# Patient Record
Sex: Male | Born: 1965 | Race: White | Hispanic: No | Marital: Married | Smoking: Former smoker
Health system: Southern US, Community
[De-identification: ages and names within clinical notes are randomized; demographics above are authoritative.]

## PROBLEM LIST (undated history)

## (undated) DIAGNOSIS — F1010 Alcohol abuse, uncomplicated: Secondary | ICD-10-CM

## (undated) HISTORY — PX: OTHER SURGICAL HISTORY: SHX169

---

## 2012-10-07 ENCOUNTER — Emergency Department (HOSPITAL_COMMUNITY)
Admission: EM | Admit: 2012-10-07 | Discharge: 2012-10-08 | Disposition: A | Discharge status: Other Acute Inpatient Hospital | Payer: BC Managed Care – PPO | Attending: Emergency Medicine | Admitting: Emergency Medicine

## 2012-10-07 ENCOUNTER — Encounter (HOSPITAL_COMMUNITY): Payer: Self-pay

## 2012-10-07 DIAGNOSIS — F1010 Alcohol abuse, uncomplicated: Secondary | ICD-10-CM

## 2012-10-07 DIAGNOSIS — Z87891 Personal history of nicotine dependence: Secondary | ICD-10-CM | POA: Insufficient documentation

## 2012-10-07 HISTORY — DX: Alcohol abuse, uncomplicated: F10.10

## 2012-10-07 LAB — RAPID URINE DRUG SCREEN, HOSP PERFORMED
Amphetamines: NOT DETECTED
Benzodiazepines: NOT DETECTED
Cocaine: NOT DETECTED
Opiates: NOT DETECTED

## 2012-10-07 LAB — SALICYLATE LEVEL: Salicylate Lvl: <2 mg/dL — ABNORMAL LOW (ref 2.8–20.0)

## 2012-10-07 LAB — COMPREHENSIVE METABOLIC PANEL
ALT: 29 U/L (ref 0–53)
AST: 33 U/L (ref 0–37)
Albumin: 4.2 g/dL (ref 3.5–5.2)
Calcium: 9.1 mg/dL (ref 8.4–10.5)
Creatinine, Ser: 0.48 mg/dL — ABNORMAL LOW (ref 0.50–1.35)
GFR calc non Af Amer: >90 mL/min (ref >90)
Sodium: 132 mEq/L — ABNORMAL LOW (ref 135–145)
Total Protein: 8.3 g/dL (ref 6.0–8.3)

## 2012-10-07 LAB — CBC
MCH: 33.6 pg (ref 26.0–34.0)
MCHC: 36.1 g/dL — ABNORMAL HIGH (ref 30.0–36.0)
MCV: 93 fL (ref 78.0–100.0)
Platelets: 234 10*3/uL (ref 150–400)
RDW: 12.6 % (ref 11.5–15.5)

## 2012-10-07 MED ORDER — THIAMINE HCL 100 MG/ML IJ SOLN: 100.0000 mg | Freq: Every day | INTRAMUSCULAR | Status: DC

## 2012-10-07 MED ORDER — LORAZEPAM 1 MG PO TABS: 1.0000 mg | ORAL_TABLET | Freq: Four times a day (QID) | ORAL | Status: DC | PRN

## 2012-10-07 MED ORDER — ADULT MULTIVITAMIN W/MINERALS CH
1.0000 | ORAL_TABLET | Freq: Every day | ORAL | Status: DC
  Administered 2012-10-07 – 2012-10-08 (×2): 1 via ORAL
  Filled 2012-10-07 (×2): qty 1

## 2012-10-07 MED ORDER — VITAMIN B-1 100 MG PO TABS
100.0000 mg | ORAL_TABLET | Freq: Every day | ORAL | Status: DC
  Administered 2012-10-07 – 2012-10-08 (×2): 100 mg via ORAL
  Filled 2012-10-07 (×2): qty 1

## 2012-10-07 MED ORDER — FOLIC ACID 1 MG PO TABS
1.0000 mg | ORAL_TABLET | Freq: Every day | ORAL | Status: DC
  Administered 2012-10-07 – 2012-10-08 (×2): 1 mg via ORAL
  Filled 2012-10-07 (×2): qty 1

## 2012-10-07 MED ORDER — PNEUMOCOCCAL VAC POLYVALENT 25 MCG/0.5ML IJ INJ
0.5000 mL | INJECTION | INTRAMUSCULAR | Status: AC
  Administered 2012-10-08: 0.5 mL via INTRAMUSCULAR
  Filled 2012-10-07 (×2): qty 0.5

## 2012-10-07 MED ORDER — LORAZEPAM 1 MG PO TABS
0.0000 mg | ORAL_TABLET | Freq: Four times a day (QID) | ORAL | Status: DC
  Administered 2012-10-07 – 2012-10-08 (×2): 1 mg via ORAL
  Filled 2012-10-07 (×2): qty 1

## 2012-10-07 MED ORDER — LORAZEPAM 1 MG PO TABS: 0.0000 mg | ORAL_TABLET | Freq: Two times a day (BID) | ORAL | Status: DC

## 2012-10-07 MED ORDER — LORAZEPAM 2 MG/ML IJ SOLN: 1.0000 mg | Freq: Four times a day (QID) | INTRAMUSCULAR | Status: DC | PRN

## 2012-10-07 NOTE — ED Provider Notes (Signed)
History    This chart was scribed for a non-physician practitioner, Roxy Horseman, PA-C, working with Nelia Shi, MD by Frederik Pear, ED Scribe. This patient was seen in room WTR1/WLPT1 and the patient's care was started at 1741.   CSN: [id number]  Arrival date & time 10/07/12  1512   First MD Initiated Contact with Patient 10/07/12 1741      Chief Complaint  Patient presents with  . Medical Clearance  . Alcohol Intoxication    (Consider location/radiation/quality/duration/timing/severity/associated sxs/prior treatment) The history is provided by the patient and medical records. No language interpreter was used.   Jared [name] is a 47 y.o. male with a h/o of ETOH abuse who presents to the Emergency Department with a chief complaint of medical clearance for ETOH detox. He states that he called his sister earlier today and told her that he was ready to seek help so she called around and was able to obtain a spot for [facility] at [facility] in a few days after he was cleared in ED. He reports that he has been drinking for years, and has never attempted to quit on his own. He reports that he consumed 8-9 beers earlier today with the last one being between 1500- 1600, which was just prior to his arrival. He denies, hallucinations, SI, and HI. He reports that he has one previous admit to rehab that was several years ago, but states that he began drinking within 8 to 10 days of leaving the program.   Past Medical History  Diagnosis Date  . Alcohol abuse     Past Surgical History  Procedure Laterality Date  . Lymph node removal      Family History  Problem Relation Age of Onset  . Cancer Mother   . Alzheimer's disease Father     History  Substance Use Topics  . Smoking status: Former Games developer  . Smokeless tobacco: Never Used  . Alcohol Use: Yes     Comment: daily      Review of Systems A complete 10 system review of systems was obtained and all systems are negative except as  noted in the HPI and PMH.  Allergies  Review of patient's allergies indicates no known allergies.  Home Medications   Current Outpatient Rx  Name  Route  Sig  Dispense  Refill  . MELATONIN PO   Oral   Take by mouth.         . Multiple Vitamin (MULTIVITAMIN WITH MINERALS) TABS   Oral   Take 1 tablet by mouth daily.           BP 128/82  Pulse 69  Temp(Src) 98.4 F (36.9 C) (Oral)  Resp 16  SpO2 100%  Physical Exam  Nursing note and vitals reviewed. Constitutional: He is oriented to person, place, and time. He appears well-developed and well-nourished. No distress.  HENT:  Head: Normocephalic and atraumatic.  Eyes: EOM are normal. Pupils are equal, round, and reactive to light.  Neck: Normal range of motion. Neck supple. No tracheal deviation present.  Cardiovascular: Normal rate, regular rhythm, normal heart sounds and intact distal pulses.  Exam reveals no gallop and no friction rub.   No murmur heard. Pulmonary/Chest: Effort normal and breath sounds normal. No respiratory distress. He has no wheezes. He has no rales. He exhibits no tenderness.  Abdominal: Soft. He exhibits no distension and no mass. There is no tenderness. There is no rebound and no guarding.  No acute abdominal pain.  Musculoskeletal:  Normal range of motion. He exhibits no edema.  Neurological: He is alert and oriented to person, place, and time.  Skin: Skin is warm and dry.  Psychiatric: He has a normal mood and affect. His behavior is normal.    ED Course  Procedures (including critical care time)  DIAGNOSTIC STUDIES: Oxygen Saturation is 100% on room air, normal by my interpretation.    COORDINATION OF CARE:  17:40- Discussed planned course of treatment with the patient, including an ACT consult, who is agreeable at this time.  Labs Reviewed  CBC - Abnormal; Notable for the following:    MCHC 36.1 (*)    All other components within normal limits  COMPREHENSIVE METABOLIC PANEL -  Abnormal; Notable for the following:    Sodium 132 (*)    Chloride 95 (*)    BUN 4 (*)    Creatinine, Ser 0.48 (*)    Total Bilirubin 0.2 (*)    All other components within normal limits  ETHANOL - Abnormal; Notable for the following:    Alcohol, Ethyl (B) 279 (*)    All other components within normal limits  SALICYLATE LEVEL - Abnormal; Notable for the following:    Salicylate Lvl <2.0 (*)    All other components within normal limits  ACETAMINOPHEN LEVEL  URINE RAPID DRUG SCREEN (HOSP PERFORMED)   No results found.   1. Alcohol abuse       MDM   I personally performed the services described in this documentation, which was scribed in my presence. The recorded information has been reviewed and is accurate.         Roxy Horseman, PA-C 10/18/12 1114

## 2012-10-07 NOTE — BH Assessment (Signed)
Assessment Note   Jared [name] is an 47 y.o. male with a 20 yr h/o of ETOH abuse. He presents to the [facility] Emergency Department requesting alcohol detox. He states that he called his sister earlier today and told her that he was ready to seek help. She called around and spoke to staff at [facility]. She was reportedly told that no beds were available at this time but a bed may become available in a few days. Patient was advised to come to [facility] for medical clearance. He reports that he has been drinking daily years. He reports that he consumed 8-9 beers earlier today. His average amount of use is 6-10 beers per day. He denies withdrawal symptoms. No history of black outs, seizures, and DT's. He reports that he has one previous admission to a rehab program in [place] that was several years ago. He was also at [facility] for detox 3 days for detox. States that after completing detox and rehab  he began drinking within 8 to 10 days of leaving the programs. He denies SI, HI, and AVH's. Patient denies depression but admits to feeling increasingly anxious.   Pt referred to [facility]; pending review and bed availability. No beds available at this time.   Patient is also interested in detox at [facility]. ACT will need to call in the am and speak with the intake coordinator to see if they have any beds available for patient tommorow. No beds available at [facility] 10/07/2012.   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 Axis II: Deferred Axis III:  Past Medical History  Diagnosis Date  . Alcohol abuse    Axis IV: other psychosocial or environmental problems, problems related to social environment, problems with access to health care services and problems with primary support group Axis V: 31-40 impairment in reality testing  Past Medical History:  Past Medical History  Diagnosis Date  . Alcohol abuse     Past Surgical History  Procedure Laterality Date  . Lymph node  removal      Family History:  Family History  Problem Relation Age of Onset  . Cancer Mother   . Alzheimer's disease Father     Social History:  reports that he has quit smoking. He has never used smokeless tobacco. He reports that  drinks alcohol. He reports that he does not use illicit drugs.  Additional Social History:  Alcohol / Drug Use Pain Medications: SEE MAR Prescriptions: SEE MAR Over the Counter: SEE MAR History of alcohol / drug use?: Yes Substance #1 Name of Substance 1: Alcohol  1 - Age of First Use: "I started drinking in [place] 1 - Amount (size/oz): (6-10) 40 oz beers 1 - Frequency: daily for the past 20 yrs 1 - Duration: 20 yrs 1 - Last Use / Amount: 10/07/2012; pt reports drinking approximately 6 beers  CIWA: CIWA-Ar BP: 128/82 mmHg Pulse Rate: 69 COWS:    Allergies: No Known Allergies  Home Medications:  (Not in a hospital admission)  OB/GYN Status:  No LMP for male patient.  General Assessment Data [facility] of [facility]: [facility] ACT Assessment: Yes Living Arrangements: Spouse/significant other Can pt return to current living arrangement?: Yes Admission Status: Voluntary Is patient capable of signing voluntary admission?: Yes Transfer from: Acute Hospital Referral Source: Self/Family/Friend     Risk to self Suicidal Ideation: No Suicidal Intent: No Is patient at risk for suicide?: No Suicidal Plan?: No Access to Means: No What has been your use  of drugs/alcohol within the last 12 months?:  (patient reports daily alcohol use for the past 20 yrs) Previous Attempts/Gestures: No How many times?:  (0) Other Self Harm Risks:  (none reported) Triggers for Past Attempts: Other (Comment) (no previous attempts and/or gestures) Intentional Self Injurious Behavior: None Family Suicide History: No Recent stressful life event(s): Other (Comment) (alcoholism) Persecutory voices/beliefs?: No Depression: No Substance abuse history and/or treatment for  substance abuse?: Yes Suicide prevention information given to non-admitted patients: Not applicable  Risk to Others Homicidal Ideation: No Thoughts of Harm to Others: No Current Homicidal Intent: No Current Homicidal Plan: No Access to Homicidal Means: No Identified Victim:  (n/a) History of harm to others?: No Assessment of Violence: None Noted Violent Behavior Description:  (none reported) Does patient have access to weapons?: No Criminal Charges Pending?: No Does patient have a court date: No  Psychosis Hallucinations: None noted Delusions: None noted  Mental Status Report Appear/Hygiene: Improved Eye Contact: Good Motor Activity: Freedom of movement Speech: Logical/coherent Level of Consciousness: Alert Mood: Depressed Affect: Appropriate to circumstance Anxiety Level: None Thought Processes: Coherent;Relevant Judgement: Unimpaired Orientation: Person;Place;Time;Situation Obsessive Compulsive Thoughts/Behaviors: None  Cognitive Functioning Concentration: Decreased Memory: Remote Intact;Recent Intact IQ: Average Insight: Good Impulse Control: Good Appetite: Good Weight Loss:  (none reported) Weight Gain:  (none reported) Sleep: No Change Total Hours of Sleep:  (6-8hrs) Vegetative Symptoms: None  ADLScreening [facility] Assessment Services) Patient's cognitive ability adequate to safely complete daily activities?: Yes Patient able to express need for assistance with ADLs?: Yes Independently performs ADLs?: Yes (appropriate for developmental age)  Abuse/Neglect [facility]) Physical Abuse: Denies Verbal Abuse: Denies Sexual Abuse: Denies  Prior Inpatient Therapy Prior Inpatient Therapy: Yes Prior Therapy Dates:  (facility in [place]-10 days; [facility]-3 days...dates unk) Prior Therapy Facilty/Provider(s):  (facilty in [place]) Reason for Treatment:  (substance abuse, detox, rehab)  Prior Outpatient Therapy Prior Outpatient Therapy: No Prior Therapy Dates:   (n/a) Prior Therapy Facilty/Provider(s):  (n/a) Reason for Treatment:  (n/a)  ADL Screening (condition at time of admission) Patient's cognitive ability adequate to safely complete daily activities?: Yes Patient able to express need for assistance with ADLs?: Yes Independently performs ADLs?: Yes (appropriate for developmental age) Weakness of Legs: None Weakness of Arms/Hands: None  Home Assistive Devices/Equipment Home Assistive Devices/Equipment: None    Abuse/Neglect Assessment (Assessment to be complete while patient is alone) Physical Abuse: Denies Verbal Abuse: Denies Sexual Abuse: Denies Values / Beliefs Cultural Requests During Hospitalization: None Spiritual Requests During Hospitalization: None Consults Spiritual Care Consult Needed: No Social Work Consult Needed: No Merchant navy officer (For Healthcare) Advance Directive: Patient does not have advance directive Nutrition Screen- [facility] Patient's home diet: Regular  Additional Information 1:1 In Past 12 Months?: Yes CIRT Risk: No Elopement Risk: No Does patient have medical clearance?: Yes     Disposition:  Disposition Initial Assessment Completed for this Encounter: Yes Disposition of Patient: Inpatient treatment program Type of inpatient treatment program: Adult  Pt referred to [facility]; pending review and bed availability. No beds available at this time.   Patient is also interested in detox at [facility]. ACT will need to call in the am and speak with the intake coordinator to see if they have any beds available for patient tommorow. No beds available at [facility] 10/07/2012.   On Site Evaluation by:   Reviewed with Physician:     Melynda Ripple [facility] 10/07/2012 9:24 PM

## 2012-10-07 NOTE — ED Notes (Signed)
Patient is requesting alcohol detox. Patient states he last drank 8 beers 2 hours ago. Patient denies any drug use.  Patient also denies SI/HI.

## 2012-10-07 NOTE — ED Notes (Signed)
Pt is admitted voluntarily at 1830 to [facility] ED. He states he drinks alcohol, up to 20 beers a day, and has been doing so " regularly" since he was in  [employer]. He is pleasant and cooperative and denies any active withdrawal symptom. VSS. He denies known drug allergies, states he does not have a PMH and / or that he does not take any  meds regularly . He is oriented to unit and verbal reprots given to night shift nurse.

## 2012-10-07 NOTE — ED Notes (Signed)
Pt said he detoxed about 7-8 years ago and doesn't notice any symptoms if he doesn't drink. He said at that time he was sober 3-4 months. He said he drinks 6-8 beers a day or maybe a little more. He said he feels fine at this time and denies any needs, SI/HI/AVH.

## 2012-10-08 ENCOUNTER — Inpatient Hospital Stay (HOSPITAL_COMMUNITY)
Admission: AD | Admit: 2012-10-08 | Discharge: 2012-10-12 | DRG: 751 | Disposition: A | Discharge status: Home / Self Care | Payer: BC Managed Care – PPO | Source: Intra-hospital | Attending: Psychiatry | Admitting: Psychiatry

## 2012-10-08 ENCOUNTER — Encounter (HOSPITAL_COMMUNITY): Payer: Self-pay | Admitting: *Deleted

## 2012-10-08 DIAGNOSIS — Z79899 Other long term (current) drug therapy: Secondary | ICD-10-CM

## 2012-10-08 DIAGNOSIS — F1994 Other psychoactive substance use, unspecified with psychoactive substance-induced mood disorder: Secondary | ICD-10-CM | POA: Diagnosis present

## 2012-10-08 DIAGNOSIS — F1020 Alcohol dependence, uncomplicated: Principal | ICD-10-CM | POA: Diagnosis present

## 2012-10-08 DIAGNOSIS — F1010 Alcohol abuse, uncomplicated: Secondary | ICD-10-CM

## 2012-10-08 MED ORDER — VITAMIN B-1 100 MG PO TABS
100.0000 mg | ORAL_TABLET | Freq: Every day | ORAL | Status: DC
  Administered 2012-10-09 – 2012-10-12 (×4): 100 mg via ORAL
  Filled 2012-10-08 (×6): qty 1

## 2012-10-08 MED ORDER — ONDANSETRON 4 MG PO TBDP: 4.0000 mg | ORAL_TABLET | Freq: Four times a day (QID) | ORAL | Status: AC | PRN

## 2012-10-08 MED ORDER — ADULT MULTIVITAMIN W/MINERALS CH
1.0000 | ORAL_TABLET | Freq: Every day | ORAL | Status: DC
  Administered 2012-10-09 – 2012-10-12 (×4): 1 via ORAL
  Filled 2012-10-08 (×6): qty 1

## 2012-10-08 MED ORDER — CHLORDIAZEPOXIDE HCL 25 MG PO CAPS
25.0000 mg | ORAL_CAPSULE | ORAL | Status: AC
  Administered 2012-10-11 – 2012-10-12 (×2): 25 mg via ORAL
  Filled 2012-10-08 (×2): qty 1

## 2012-10-08 MED ORDER — MAGNESIUM HYDROXIDE 400 MG/5ML PO SUSP: 30.0000 mL | Freq: Every day | ORAL | Status: DC | PRN

## 2012-10-08 MED ORDER — ALUM & MAG HYDROXIDE-SIMETH 200-200-20 MG/5ML PO SUSP: 30.0000 mL | ORAL | Status: DC | PRN

## 2012-10-08 MED ORDER — HYDROXYZINE HCL 25 MG PO TABS: 25.0000 mg | ORAL_TABLET | Freq: Four times a day (QID) | ORAL | Status: AC | PRN

## 2012-10-08 MED ORDER — TRAZODONE HCL 100 MG PO TABS
100.0000 mg | ORAL_TABLET | Freq: Every evening | ORAL | Status: DC | PRN
  Administered 2012-10-08 – 2012-10-11 (×4): 100 mg via ORAL
  Filled 2012-10-08: qty 1
  Filled 2012-10-08: qty 7
  Filled 2012-10-08 (×3): qty 1

## 2012-10-08 MED ORDER — LOPERAMIDE HCL 2 MG PO CAPS: 2.0000 mg | ORAL_CAPSULE | ORAL | Status: AC | PRN

## 2012-10-08 MED ORDER — ACETAMINOPHEN 325 MG PO TABS
650.0000 mg | ORAL_TABLET | Freq: Four times a day (QID) | ORAL | Status: DC | PRN
  Administered 2012-10-09 – 2012-10-10 (×2): 650 mg via ORAL

## 2012-10-08 MED ORDER — CHLORDIAZEPOXIDE HCL 25 MG PO CAPS
25.0000 mg | ORAL_CAPSULE | Freq: Four times a day (QID) | ORAL | Status: AC
  Administered 2012-10-08 – 2012-10-10 (×6): 25 mg via ORAL
  Filled 2012-10-08 (×6): qty 1

## 2012-10-08 MED ORDER — CHLORDIAZEPOXIDE HCL 25 MG PO CAPS
25.0000 mg | ORAL_CAPSULE | Freq: Three times a day (TID) | ORAL | Status: AC
  Administered 2012-10-10 – 2012-10-11 (×3): 25 mg via ORAL
  Filled 2012-10-08 (×3): qty 1

## 2012-10-08 MED ORDER — CHLORDIAZEPOXIDE HCL 25 MG PO CAPS: 25.0000 mg | ORAL_CAPSULE | Freq: Four times a day (QID) | ORAL | Status: AC | PRN

## 2012-10-08 MED ORDER — CHLORDIAZEPOXIDE HCL 25 MG PO CAPS: 25.0000 mg | ORAL_CAPSULE | Freq: Every day | ORAL | Status: DC

## 2012-10-08 NOTE — ED Provider Notes (Signed)
Pt seen in ED his sister has call into [facility] for admission there but ACT was unable to talk to anyone last pm after hours.Pt ready to take treatment for his alcoholic drinking.He can undergo detox + rehab at [facility] which is his preference.Will hold off on [facility] admission until [facility] responds to pt request

## 2012-10-08 NOTE — [facility] Counselor (Signed)
Pt accepted by Fransisca Kaufmann, NP, to [facility] to the service of Geoffery Lyons, MD, Rm 307-1. RN and EDP notified. Support paperwork signed and faxed to [facility]. Originals placed in pt's chart.  Evette Cristal, [place] Assessment Counselor

## 2012-10-08 NOTE — ED Notes (Signed)
Vaccine card filled out for pt getting pneumococcal vaccine and being sent with his paperwork. [facility] is ready for pt to transport.

## 2012-10-08 NOTE — Tx Team (Signed)
Initial Interdisciplinary Treatment Plan  PATIENT STRENGTHS: (choose at least two) Ability for insight Average or above average intelligence Capable of independent living Communication skills Financial means General fund of knowledge Motivation for treatment/growth Physical Health Supportive family/friends Work skills  PATIENT STRESSORS: Marital or family conflict Substance abuse   PROBLEM LIST: Problem List/Patient Goals Date to be addressed Date deferred Reason deferred Estimated date of resolution  ETOH abuse 10/08/12                                                      DISCHARGE CRITERIA:  Improved stabilization in mood, thinking, and/or behavior Need for constant or close observation no longer present Reduction of life-threatening or endangering symptoms to within safe limits Withdrawal symptoms are absent or subacute and managed without 24-hour nursing intervention  PRELIMINARY DISCHARGE PLAN: Attend 12-step recovery group Return to previous living arrangement Return to previous work or school arrangements  PATIENT/FAMIILY INVOLVEMENT: This treatment plan has been presented to and reviewed with the patient, Jared [name], and/or family member.  The patient and family have been given the opportunity to ask questions and make suggestions.  Jared [name] [facility] 10/08/2012, 11:09 PM

## 2012-10-08 NOTE — BH Assessment (Signed)
[facility] Assessment Progress Note  Pt reviewed with Fransisca Kaufmann, NP, who agrees to accept him to [facility] to the service of Geoffery Lyons, MD, Rm 307-1.  Per Rosey Bath, RN, Neurosurgeon, pt is to be transferred to [facility] as close to 20:30 as is practical.  Attempted to call Ranae Pila, Assessment Counselor at 18:00 to notify her, but call rolled to voice mail.  Left message advising her to check this message in [facility] for details.  Doylene Canning, MA Assessment Counselor 10/08/2012 @ 18:02

## 2012-10-08 NOTE — Progress Notes (Signed)
Pt is vol admitted by way of [facility] after being medically cleared. He is here to detox from ETOH and reports drinking 6-10 beers daily. Denies any life stressors stating, "it's just what I do. It's become routine. I work and then come home and drink." Asked pt why he is seeking treatment now and pt states, "well, there are some things that may need to be worked out with my wife. I figured it was best to have a clear head." Oriented to unit, fluids provided (refused meal). Level III obs initiated. Denies any medical problems, VSS and CIWA is a "0." No allergies and pt is low fall risk. Denies SI/HI/aVH and remains safe resting in bed. Lawrence Marseilles

## 2012-10-08 NOTE — ED Notes (Signed)
Up to the desk on the phone 

## 2012-10-08 NOTE — ED Notes (Signed)
Up tot he bathroom to shower and change scrubs 

## 2012-10-08 NOTE — ED Notes (Signed)
CDC info, wallet card given to pt (put in his belongings)

## 2012-10-08 NOTE — BH Assessment (Signed)
[facility] Assessment Progress Note   [facility] has psych and detox beds and info was sent there at 1030.  Provided cell number and [facility] assessment number as call back for [facility] to follow up    

## 2012-10-08 NOTE — ED Provider Notes (Signed)
Patient sleeping, no issues overnight. BP 113/70  Pulse 79  Temp(Src) 97.8 F (36.6 C) (Oral)  Resp 18  SpO2 99%   Glynn Octave, MD 10/08/12 [phone]

## 2012-10-09 MED ORDER — IBUPROFEN 600 MG PO TABS
600.0000 mg | ORAL_TABLET | Freq: Four times a day (QID) | ORAL | Status: DC | PRN
  Administered 2012-10-09 – 2012-10-11 (×3): 600 mg via ORAL
  Filled 2012-10-09 (×4): qty 1

## 2012-10-09 NOTE — [facility] Suicide Risk Assessment (Signed)
Suicide Risk Assessment  Admission Assessment     Nursing information obtained from:  Patient Demographic factors:  Male;Caucasian Current Mental Status:  No si, no hi, no avh, logical, mood is ok  Loss Factors:   (none) tired of drinking Historical Factors:  Family history of mental illness or substance abuse Risk Reduction Factors:  Responsible for children under 45 years of age;Sense of responsibility to family;Employed;Living with another person, especially a relative;Positive social support;Positive therapeutic relationship;Positive coping skills or problem solving skills  CLINICAL FACTORS:   Alcohol/Substance Abuse/Dependencies  COGNITIVE FEATURES THAT CONTRIBUTE TO RISK:  Closed-mindedness    SUICIDE RISK:   Minimal: No identifiable suicidal ideation.  Patients presenting with no risk factors but with morbid ruminations; may be classified as minimal risk based on the severity of the depressive symptoms  PLAN OF CARE:  Continue detox  I certify that inpatient services furnished can reasonably be expected to improve the patient's condition.  Wonda Cerise 10/09/2012, 11:55 AM

## 2012-10-09 NOTE — [facility] Group Notes (Signed)
[facility] Group Notes:  (Clinical Social Work)  10/09/2012  10:00-11:00AM  Summary of Progress/Problems:   The main focus of today's process group was to   identify the patient's current support system and decide on other supports that can be put in place.  Four definitions/levels of support were discussed and an exercise was utilized to show how much stronger we become with additional supports.  An emphasis was placed on using counselor, doctor, therapy groups, 12-step groups, and problem-specific support groups to expand supports, as well as doing something different than has been done before. The patient expressed a willingness to add a therapist and investigate other types of groups as well, to his existing supports of wife, sister, son, co-workers, and ex-wife.  Type of Therapy:  Process Group with Motivational Interviewing  Participation Level:  Active  Participation Quality:  Attentive  Affect:  Blunted  Cognitive:  Appropriate and Oriented  Insight:  Developing/Improving  Engagement in Therapy:  Developing/Improving  Modes of Intervention:   Education, Support and Processing, Activity  Ambrose Mantle, LCSW 10/09/2012, 12:28 PM
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
## 2012-10-09 NOTE — Progress Notes (Signed)
[facility] Group Notes:  (Nursing/MHT/Case Management/Adjunct)  Date:  10/09/2012  Time:  11:53 PM  Type of Therapy:  Group Therapy  Participation Level:  Active  Participation Quality:  Appropriate  Affect:  Appropriate  Cognitive:  Appropriate  Insight:  Appropriate  Engagement in Group:  Developing/Improving  Modes of Intervention:  Socialization and Support  Summary of Progress/Problems: Pt. Stated his thoughts, feelings, and actions in healthy support systems.  Pt. Stated he has a fear of job stability with his son starting college.  Sondra Come 10/09/2012, 11:53 PM

## 2012-10-09 NOTE — Progress Notes (Signed)
Spoke with pt 1:1 who reports "doing fine" this evening. Denies withdrawal, VSS. Injured L foot during rec tonight but only rates the pain at a 3/10. Refuses ice pack or tylenol. States it only hurts with weight bearing/movement. Given wheelchair and made high fall risk. Medicated per orders, trazadone prn given per request. Denies any SI/HI/AVH and remains safe. Lawrence Marseilles

## 2012-10-09 NOTE — H&P (Signed)
Psychiatric Admission Assessment Adult  Patient Identification:  Jared [name]  Date of Evaluation:  10/09/2012  Chief Complaint:  ETOH DEPENDENCE ANXIETY D/O,NOS  History of Present Illness: This is a 47 year old Caucasian male. Admitted to [facility] from the [facility] with complaints of excessive use of alcohol requesting detoxification treatment. Patient reports, "My son dropped me off to the hospital by my request. I needed a kick start to stop drinking alcohol for good. I have been drinking alcohol on a regular basis throughout my adult life, and heavily x 17 years. I'm an alcoholic. I have tried to stop drinking in the past, but has not been successful. I have been sober once for about a month, and that was 10 years ago. Then about 8 years ago, I was sober for about 5 months as well, then relapsed. Alcohol in fact relaxes me and calms my mind. At this point in my life, I would say drinking alcohol has become an old habit for me. I received DUI many years ago. No pending charges against me at this point.  Alcoholism did infact affect my relationships over the years. I don't believe that I'm depressed, just a little anxious".  Elements:  Location:  [facility] adult unit. Quality:  Cravings, cravings, cravings. Severity:  " I drink a lot of alcohol, I mean a lot". Timing:  "I started drinking very heavily x 17 years". Duration:  "I have been an alcoholic x for 20 years". Context:  "An old habit that never dies, relationship problems".  Associated Signs/Synptoms:  Depression Symptoms:  feelings of worthlessness/guilt, anxiety,  (Hypo) Manic Symptoms:  Impulsivity,  Anxiety Symptoms:  Excessive Worry,  Psychotic Symptoms:  Hallucinations: Denies  PTSD Symptoms: Had a traumatic exposure:  Denies  Psychiatric Specialty Exam: Physical Exam  Constitutional: He is oriented to person, place, and time. He appears well-developed.  HENT:  Head: Normocephalic.  Eyes: Pupils are equal,  round, and reactive to light.  Neck: Normal range of motion.  Cardiovascular: Normal rate.   Respiratory: Effort normal.  GI: Soft.  Musculoskeletal: Normal range of motion.  Neurological: He is alert and oriented to person, place, and time.  Skin: Skin is warm and dry.  Psychiatric: His speech is normal and behavior is normal. Thought content normal. His mood appears anxious. Cognition and memory are normal. He expresses impulsivity. He does not exhibit a depressed mood.    Review of Systems  Constitutional: Negative.   HENT: Negative.   Eyes: Negative.   Respiratory: Negative.   Cardiovascular: Negative.   Gastrointestinal: Positive for abdominal pain.  Genitourinary: Negative.   Musculoskeletal: Negative.   Skin: Negative.   Neurological: Positive for tremors.  Endo/Heme/Allergies: Negative.   Psychiatric/Behavioral: Positive for substance abuse (Alcoholim). Negative for depression, suicidal ideas, hallucinations and memory loss. The patient is nervous/anxious. The patient does not have insomnia.     Blood pressure 113/79, pulse 97, temperature 97 F (36.1 C), temperature source Oral, resp. rate 18, height 5\' 10"  (1.778 m), weight 58.514 kg (129 lb).Body mass index is 18.51 kg/(m^2).  General Appearance: Casual, Fairly Groomed and thin framed  Eye Contact::  Good  Speech:  Clear and Coherent  Volume:  Normal  Mood:  Anxious  Affect:  Congruent  Thought Process:  Coherent and Goal Directed  Orientation:  Full (Time, Place, and Person)  Thought Content:  Rumination  Suicidal Thoughts:  No  Homicidal Thoughts:  No  Memory:  Immediate;   Good Recent;   Good Remote;  Good  Judgement:  Fair  Insight:  Good  Psychomotor Activity:  Restlessness  Concentration:  Fair  Recall:  Good  Akathisia:  No  Handed:  Right  AIMS (if indicated):     Assets:  Desire for Improvement  Sleep:  Number of Hours: 4.75    Past Psychiatric History: Diagnosis: Alcohol dependence   Hospitalizations:, [facility], [facility]  Outpatient Care: None reported  Substance Abuse Care:   Self-Mutilation: Denies  Suicidal Attempts: Denies attempts and or thoughts  Violent Behaviors: None reported   Past Medical History:   Past Medical History  Diagnosis Date  . Alcohol abuse    None.  Allergies:  No Known Allergies  PTA Medications: Prescriptions prior to admission  Medication Sig Dispense Refill  . Multiple Vitamin (MULTIVITAMIN WITH MINERALS) TABS Take 1 tablet by mouth daily.        Previous Psychotropic Medications:  Medication/Dose  See medication lists               Substance Abuse History in the last 12 months:  yes  Consequences of Substance Abuse: Medical Consequences:  Liver damage, Possible death by overdose Legal Consequences:  Arrests, jail time, Loss of driving privilege. Family Consequences:  Family discord, divorce and or separation.  Social History:  reports that he has quit smoking. He has never used smokeless tobacco. He reports that  drinks alcohol. He reports that he does not use illicit drugs. Additional Social History:  Current Place of Residence: [place], [place]    Place of Birth: [place], [place]   Family Members: "My wife"  Marital Status:  Married  Children: 1  Sons: 1  Daughters: 0  Relationships: Married  Education:  Acupuncturist Problems/Performance: Completed 4 year college  Religious Beliefs/Practices: NA  History of Abuse (Emotional/Phsycial/Sexual): Denies  Occupational Experiences: Employed  Hotel manager History:  None.  Legal History: None pending  Hobbies/Interests: None reported  Family History:   Family History  Problem Relation Age of Onset  . Cancer Mother   . Alzheimer's disease Father     Results for orders placed during the hospital encounter of 10/07/12 (from the past 72 hour(s))  ACETAMINOPHEN LEVEL     Status: None   Collection Time    10/07/12  4:07 PM      Result Value Range    Acetaminophen (Tylenol), Serum <15.0  10 - 30 ug/mL   Comment:            THERAPEUTIC CONCENTRATIONS VARY     SIGNIFICANTLY. A RANGE OF 10-30     ug/mL MAY BE AN EFFECTIVE     CONCENTRATION FOR MANY PATIENTS.     HOWEVER, SOME ARE BEST TREATED     AT CONCENTRATIONS OUTSIDE THIS     RANGE.     ACETAMINOPHEN CONCENTRATIONS     >150 ug/mL AT 4 HOURS AFTER     INGESTION AND >50 ug/mL AT 12     HOURS AFTER INGESTION ARE     OFTEN ASSOCIATED WITH TOXIC     REACTIONS.  CBC     Status: Abnormal   Collection Time    10/07/12  4:07 PM      Result Value Range   WBC 8.3  4.0 - 10.5 K/uL   RBC 4.44  4.22 - 5.81 MIL/uL   Hemoglobin 14.9  13.0 - 17.0 g/dL   HCT 11.9  14.7 - 82.9 %   MCV 93.0  78.0 - 100.0 fL   MCH 33.6  26.0 -  34.0 pg   MCHC 36.1 (*) 30.0 - 36.0 g/dL   RDW 19.1  47.8 - 29.5 %   Platelets 234  150 - 400 K/uL  COMPREHENSIVE METABOLIC PANEL     Status: Abnormal   Collection Time    10/07/12  4:07 PM      Result Value Range   Sodium 132 (*) 135 - 145 mEq/L   Potassium 3.9  3.5 - 5.1 mEq/L   Chloride 95 (*) 96 - 112 mEq/L   CO2 28  19 - 32 mEq/L   Glucose, Bld 95  70 - 99 mg/dL   BUN 4 (*) 6 - 23 mg/dL   Creatinine, Ser 6.21 (*) 0.50 - 1.35 mg/dL   Calcium 9.1  8.4 - 30.8 mg/dL   Total Protein 8.3  6.0 - 8.3 g/dL   Albumin 4.2  3.5 - 5.2 g/dL   AST 33  0 - 37 U/L   ALT 29  0 - 53 U/L   Alkaline Phosphatase 66  39 - 117 U/L   Total Bilirubin 0.2 (*) 0.3 - 1.2 mg/dL   GFR calc non Af Amer >90  >90 mL/min   GFR calc Af Amer >90  >90 mL/min   Comment:            The eGFR has been calculated     using the CKD EPI equation.     This calculation has not been     validated in all clinical     situations.     eGFR's persistently     <90 mL/min signify     possible Chronic Kidney Disease.  ETHANOL     Status: Abnormal   Collection Time    10/07/12  4:07 PM      Result Value Range   Alcohol, Ethyl (B) 279 (*) 0 - 11 mg/dL   Comment:            LOWEST DETECTABLE LIMIT  FOR     SERUM ALCOHOL IS 11 mg/dL     FOR MEDICAL PURPOSES ONLY  SALICYLATE LEVEL     Status: Abnormal   Collection Time    10/07/12  4:07 PM      Result Value Range   Salicylate Lvl <2.0 (*) 2.8 - 20.0 mg/dL  URINE RAPID DRUG SCREEN (HOSP PERFORMED)     Status: None   Collection Time    10/07/12  4:19 PM      Result Value Range   Opiates NONE DETECTED  NONE DETECTED   Cocaine NONE DETECTED  NONE DETECTED   Benzodiazepines NONE DETECTED  NONE DETECTED   Amphetamines NONE DETECTED  NONE DETECTED   Tetrahydrocannabinol NONE DETECTED  NONE DETECTED   Barbiturates NONE DETECTED  NONE DETECTED   Comment:            DRUG SCREEN FOR MEDICAL PURPOSES     ONLY.  IF CONFIRMATION IS NEEDED     FOR ANY PURPOSE, NOTIFY LAB     WITHIN 5 DAYS.                LOWEST DETECTABLE LIMITS     FOR URINE DRUG SCREEN     Drug Class       Cutoff (ng/mL)     Amphetamine      1000     Barbiturate      200     Benzodiazepine   200     Tricyclics       300  Opiates          300     Cocaine          300     THC              50   Psychological Evaluations:  Assessment:   AXIS I:  Alcohol dependence AXIS II:  Deferred AXIS III:   Past Medical History  Diagnosis Date  . Alcohol abuse    AXIS IV:  other psychosocial or environmental problems and Substance dependence AXIS V:  1-10 persistent dangerousness to self and others present  Treatment Plan/Recommendations: 1. Admit for crisis management and stabilization, estimated length of stay 3-5 days.  2. Medication management to reduce current symptoms to base line and improve the patient's overall level of functioning  3. Treat health problems as indicated.  4. Develop treatment plan to decrease risk of relapse upon discharge and the need for readmission.  5. Psycho-social education regarding relapse prevention and self care.  6. Health care follow up as needed for medical problems.  7. Review, reconcile, and reinstate any pertinent home  medications for other health issues where appropriate. 8. Call for consults with hospitalist for any additional specialty patient care services as needed.  Treatment Plan Summary: Daily contact with patient to assess and evaluate symptoms and progress in treatment Medication management  Current Medications:  Current Facility-Administered Medications  Medication Dose Route Frequency Provider Last Rate Last Dose  . acetaminophen (TYLENOL) tablet 650 mg  650 mg Oral Q6H PRN Fransisca Kaufmann, NP      . alum & mag hydroxide-simeth (MAALOX/MYLANTA) 200-200-20 MG/5ML suspension 30 mL  30 mL Oral Q4H PRN Fransisca Kaufmann, NP      . chlordiazePOXIDE (LIBRIUM) capsule 25 mg  25 mg Oral Q6H PRN Fransisca Kaufmann, NP      . chlordiazePOXIDE (LIBRIUM) capsule 25 mg  25 mg Oral QID Fransisca Kaufmann, NP   25 mg at 10/09/12 0800   Followed by  . [START ON 10/10/2012] chlordiazePOXIDE (LIBRIUM) capsule 25 mg  25 mg Oral TID Fransisca Kaufmann, NP       Followed by  . [START ON 10/11/2012] chlordiazePOXIDE (LIBRIUM) capsule 25 mg  25 mg Oral BH-qamhs Fransisca Kaufmann, NP       Followed by  . [START ON 10/13/2012] chlordiazePOXIDE (LIBRIUM) capsule 25 mg  25 mg Oral Daily Fransisca Kaufmann, NP      . hydrOXYzine (ATARAX/VISTARIL) tablet 25 mg  25 mg Oral Q6H PRN Fransisca Kaufmann, NP      . loperamide (IMODIUM) capsule 2-4 mg  2-4 mg Oral PRN Fransisca Kaufmann, NP      . magnesium hydroxide (MILK OF MAGNESIA) suspension 30 mL  30 mL Oral Daily PRN Fransisca Kaufmann, NP      . multivitamin with minerals tablet 1 tablet  1 tablet Oral Daily Fransisca Kaufmann, NP   1 tablet at 10/09/12 0800  . ondansetron (ZOFRAN-ODT) disintegrating tablet 4 mg  4 mg Oral Q6H PRN Fransisca Kaufmann, NP      . thiamine (VITAMIN B-1) tablet 100 mg  100 mg Oral Daily Fransisca Kaufmann, NP   100 mg at 10/09/12 0800  . traZODone (DESYREL) tablet 100 mg  100 mg Oral QHS PRN Fransisca Kaufmann, NP   100 mg at 10/08/12 2238    Observation Level/Precautions:  15 minute checks  Laboratory:  Reviewed ED lab findings on  file  Psychotherapy:  Group sessions, AA/NA meetings  Medications:  See medication lists  Consultations:  As needed  Discharge Concerns: Maintaining Sobriety  Estimated LOS: 3-5 days  Other:     I certify that inpatient services furnished can reasonably be expected to improve the patient's condition.   Armandina Stammer I, PMHNP-BC 6/8/20149:31 AM

## 2012-10-10 ENCOUNTER — Encounter (HOSPITAL_COMMUNITY): Payer: Self-pay | Admitting: Psychiatry

## 2012-10-10 DIAGNOSIS — F1994 Other psychoactive substance use, unspecified with psychoactive substance-induced mood disorder: Secondary | ICD-10-CM | POA: Diagnosis present

## 2012-10-10 DIAGNOSIS — F1020 Alcohol dependence, uncomplicated: Secondary | ICD-10-CM | POA: Diagnosis present

## 2012-10-10 NOTE — Progress Notes (Addendum)
Patient ID: Jared [name], male   DOB: Apr 16, 1966, 47 y.o.   MRN: [id number] D: Client is alert and oriented x 4; ambulating with wheelchair due to recent injury to R foot s/p fall in the gym yesterday. C/o pain in R foot 4/10. Mood is euthymic; speech soft; affect calm with minimal smiling; denies SI/HI/AVH; No ETOH W/D symptoms noted or expressed. A: Administered Tylenol PRN at 0750 and offered ice pack. Encouraged group therapy attendance and participation. Assessed for safety and withdrawal symptoms. Provided relapse prevention strategies and encouraged client to think of individualized coping skills and identification of relapse triggers. R: Client is goal oriented and expressed his goal today to "start each day on a positive path/challenge and a more rewarding course." Client is motivated for long term outpatient treatment AMB attitude, group attendance, and goal-orientation. Will continue to monitor safety on q 15 min checks.

## 2012-10-10 NOTE — Progress Notes (Signed)
Adult Psychoeducational Group Note  Date:  10/10/2012 Time:  9:50 PM  Group Topic/Focus:  Wrap-Up Group:   The focus of this group is to help patients review their daily goal of treatment and discuss progress on daily workbooks.  Participation Level:  Active  Participation Quality:  Appropriate and Sharing  Affect:  Appropriate  Cognitive:  Appropriate  Insight: Appropriate  Engagement in Group:  Engaged  Modes of Intervention:  Support  Additional Comments:  Pt spoke about how he hurt his leg today but was still remaining positive.   Humberto Seals Monique 10/10/2012, 9:50 PM

## 2012-10-10 NOTE — [facility] Group Notes (Signed)
[facility] LCSW Group Therapy  10/10/2012 3:10 PM  Type of Therapy:  Group Therapy  Participation Level:  Active  Participation Quality:  Attentive  Affect:  Appropriate  Cognitive:  Appropriate  Insight:  Engaged  Engagement in Therapy:  Engaged  Modes of Intervention:  Clarification, Discussion, Education, Socialization and Support  Summary of Progress/Problems: Today's Topic: Overcoming Obstacles. Pt identified obstacles faced currently and processed barriers involved in overcoming these obstacles. Pt identified steps necessary for overcoming these obstacles and explored motivation (internal and external) for facing these difficulties head on. Pt further identified one area of concern in their lives and chose a skill of focus pulled from their "toolbox." Jared [name] discussed how his biggest obstacle is drinking due to it's negative affect regarding his ability to work. Jared [name] processed how he can overcome his substance abuse by attending meetings, attending SA o/p, and asking for help when needed. Jared [name] talked about his support system and how this is helpful for him when facing relapse.    [name], Jared Garmany 10/10/2012, 3:10 PM

## 2012-10-10 NOTE — Progress Notes (Signed)
Adult Psychoeducational Group Note  Date:  10/10/2012 Time:  3:32 PM  Group Topic/Focus:  Self Care:   The focus of this group is to help patients understand the importance of self-care in order to improve or restore emotional, physical, spiritual, interpersonal, and financial health.  Participation Level:  Active  Participation Quality:  Appropriate  Affect:  Appropriate  Cognitive:  Appropriate  Insight: Appropriate  Engagement in Group:  Engaged and Supportive  Modes of Intervention:  Activity, Exploration, Socialization and Support  Additional Comment Patient was attentive and supportive during group.   Ronelle Nigh D 10/10/2012, 3:32 PM

## 2012-10-10 NOTE — Progress Notes (Signed)
Patient ID: Jared [name], male   DOB: 11/27/1965, 47 y.o.   MRN: [id number] Client verbalized no relief from tylenol and continues to rate pain at a 4-5 out of 10. Assessed right ankle and its noted for mild swelling and discoloration. Applied ice pack and added leg elevator on wheelchair for added support; administered ibuprofen PRN. Will continue to monitor and reassess pain level.

## 2012-10-10 NOTE — [facility] Suicide Risk Assessment (Signed)
[facility] INPATIENT: Family/Significant Other Suicide Prevention Education   Suicide Prevention Education:  Education Completed; No one has been identified by the patient as the family member/significant other with whom the patient will be residing, and identified as the person(s) who will aid the patient in the event of a mental health crisis (suicidal ideations/suicide attempt). With written consent from the patient, the family member/significant other has been provided the following suicide prevention education, prior to the and/or following the discharge of the patient.  The suicide prevention education provided includes the following:  Suicide risk factors  Suicide prevention and interventions  National Suicide Hotline telephone number  [facility] assessment telephone number  [facility] Emergency Assistance 911  [facility] and/or Residential Mobile Crisis Unit telephone number Request made of family/significant other to:  Remove weapons (e.g., guns, rifles, knives), all items previously/currently identified as safety concern.  Remove drugs/medications (over-the-counter, prescriptions, illicit drugs), all items previously/currently identified as a safety concern. The family member/significant other verbalizes understanding of the suicide prevention education information provided. The family member/significant other agrees to remove the items of safety concern listed above. Pt did not c/o SI at admission, nor have they endorsed SI during their stay here. SPE not required.  Demoni Gergen Smart LCSWA 10/10/2012 5:38 PM

## 2012-10-10 NOTE — B[facility] Group Notes (Signed)
[facility] LCSW Aftercare Discharge Planning Group Note   10/10/2012 11:25 AM  Participation Quality:  Appropriate   Mood/Affect:  Appropriate  Depression Rating:  2  Anxiety Rating:  2  Thoughts of Suicide:  No Will you contract for safety?   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 He would like to follow up o/p and is currently exploring options. Pt will also need med management.  Transportation Means: wife   Supports: wife and family   Smart, [name]

## 2012-10-10 NOTE — Progress Notes (Signed)
[facility] MD Progress Note  10/10/2012 1:00 PM Jared [name]  MRN:  [id number] Subjective:  States that his drinking got out of control. It was his son who brought him here. He had recognized already he had to do something about it. He feels that he got in the habit of drinking after work as a way to deal with the stress and the one beer led to another and another. He states that while he is busy he does not crave it. When he gets home or it gets closer for him to get home he starts craving it. Diagnosis:  Alcohol Dependence, Substance Induced Mood Disorder  ADL's:  Intact  Sleep: Fair  Appetite:  Fair  Suicidal Ideation:  Plan:  denies Intent:  denies Means:  denies Homicidal Ideation:  Plan:  denies Intent:  denies Means:  denies AEB (as evidenced by):  Psychiatric Specialty Exam: Review of Systems  Constitutional: Negative.   HENT: Negative.   Eyes: Negative.   Respiratory: Negative.   Cardiovascular: Negative.   Gastrointestinal: Negative.   Genitourinary: Negative.   Musculoskeletal: Positive for joint pain.       Ankle swealling  Skin: Negative.   Neurological: Negative.   Endo/Heme/Allergies: Negative.   Psychiatric/Behavioral: Positive for substance abuse.    Blood pressure 113/77, pulse 96, temperature 96.7 F (35.9 C), temperature source Oral, resp. rate 20, height 5\' 10"  (1.778 m), weight 58.514 kg (129 lb).Body mass index is 18.51 kg/(m^2).  General Appearance: Fairly Groomed  Patent attorney::  Fair  Speech:  Clear and Coherent  Volume:  Normal  Mood:  Anxious and worried  Affect:  anxious, worried  Thought Process:  Coherent and Goal Directed  Orientation:  Full (Time, Place, and Person)  Thought Content:  worries, concerns  Suicidal Thoughts:  No  Homicidal Thoughts:  No  Memory:  Immediate;   Fair Recent;   Fair Remote;   Fair  Judgement:  Fair  Insight:  Present  Psychomotor Activity:  Restlessness  Concentration:  Fair  Recall:  Fair  Akathisia:  No   Handed:  Right  AIMS (if indicated):     Assets:  Desire for Improvement Housing Social Support Vocational/Educational  Sleep:  Number of Hours: 6   Current Medications: Current Facility-Administered Medications  Medication Dose Route Frequency Provider Last Rate Last Dose  . acetaminophen (TYLENOL) tablet 650 mg  650 mg Oral Q6H PRN Fransisca Kaufmann, NP   650 mg at 10/10/12 0750  . alum & mag hydroxide-simeth (MAALOX/MYLANTA) 200-200-20 MG/5ML suspension 30 mL  30 mL Oral Q4H PRN Fransisca Kaufmann, NP      . chlordiazePOXIDE (LIBRIUM) capsule 25 mg  25 mg Oral Q6H PRN Fransisca Kaufmann, NP      . chlordiazePOXIDE (LIBRIUM) capsule 25 mg  25 mg Oral TID Fransisca Kaufmann, NP   25 mg at 10/10/12 1124   Followed by  . [START ON 10/11/2012] chlordiazePOXIDE (LIBRIUM) capsule 25 mg  25 mg Oral BH-qamhs Fransisca Kaufmann, NP       Followed by  . [START ON 10/13/2012] chlordiazePOXIDE (LIBRIUM) capsule 25 mg  25 mg Oral Daily Fransisca Kaufmann, NP      . hydrOXYzine (ATARAX/VISTARIL) tablet 25 mg  25 mg Oral Q6H PRN Fransisca Kaufmann, NP      . ibuprofen (ADVIL,MOTRIN) tablet 600 mg  600 mg Oral Q6H PRN Wonda Cerise, MD   600 mg at 10/10/12 1120  . loperamide (IMODIUM) capsule 2-4 mg  2-4 mg Oral PRN Fransisca Kaufmann, NP      .  magnesium hydroxide (MILK OF MAGNESIA) suspension 30 mL  30 mL Oral Daily PRN Fransisca Kaufmann, NP      . multivitamin with minerals tablet 1 tablet  1 tablet Oral Daily Fransisca Kaufmann, NP   1 tablet at 10/10/12 0751  . ondansetron (ZOFRAN-ODT) disintegrating tablet 4 mg  4 mg Oral Q6H PRN Fransisca Kaufmann, NP      . thiamine (VITAMIN B-1) tablet 100 mg  100 mg Oral Daily Fransisca Kaufmann, NP   100 mg at 10/10/12 0751  . traZODone (DESYREL) tablet 100 mg  100 mg Oral QHS PRN Fransisca Kaufmann, NP   100 mg at 10/09/12 2236    Lab Results: No results found for this or any previous visit (from the past 48 hour(s)).  Physical Findings: AIMS: Facial and Oral Movements Muscles of Facial Expression: None, normal Lips and Perioral Area: None,  normal Jaw: None, normal Tongue: None, normal,Extremity Movements Upper (arms, wrists, hands, fingers): None, normal Lower (legs, knees, ankles, toes): None, normal, Trunk Movements Neck, shoulders, hips: None, normal, Overall Severity Severity of abnormal movements (highest score from questions above): None, normal Incapacitation due to abnormal movements: None, normal Patient's awareness of abnormal movements (rate only patient's report): No Awareness, Dental Status Current problems with teeth and/or dentures?: No Does patient usually wear dentures?: No  CIWA:  CIWA-Ar Total: 0 COWS:     Treatment Plan Summary: Daily contact with patient to assess and evaluate symptoms and progress in treatment Medication management  Plan: Supportive approach/coping skills/relapse prevention           Continue and complete the detox protocol           Reassess and address the co morbidities           Discuss medications for cravings  Medical Decision Making Problem Points:  Review of psycho-social stressors (1) Data Points:  Review of medication regiment & side effects (2)  I certify that inpatient services furnished can reasonably be expected to improve the patient's condition.   Jared [name] A 10/10/2012, 1:00 PM

## 2012-10-10 NOTE — Tx Team (Signed)
Interdisciplinary Treatment Plan Update (Adult)  Date: 10/10/2012  Time Reviewed:5:24 PM  Progress in Treatment:  Attending groups: Yes  Participating in groups:  Yes Taking medication as prescribed: Yes  Tolerating medication: Yes  Family/Significant othe contact made: No  Patient understands diagnosis: Yes, AEB seeking treatment for substance abuse. Discussing patient identified problems/goals with staff: Yes  Medical problems stabilized or resolved: Yes  Denies suicidal/homicidal ideation: Yes  Patient has not harmed self or Others: Yes  New problem(s) identified:  Discharge Plan or Barriers: Pt plans to follow up for SA o/p and is currently exploring options with [employer] insurance coverage.  Additional comments: Jared [name] is an 47 y.o. male with a 20 yr h/o of ETOH abuse. He presents to the [facility] Emergency Department requesting alcohol detox. He states that he called his sister earlier today and told her that he was ready to seek help. She called around and spoke to staff at [facility]. She was reportedly told that no beds were available at this time but a bed may become available in a few days. Patient was advised to come to [facility] for medical clearance. He reports that he has been drinking daily years. He reports that he consumed 8-9 beers earlier today. His average amount of use is 6-10 beers per day. He denies withdrawal symptoms. No history of black outs, seizures, and DT's. He reports that he has one previous admission to a rehab program in [place] that was several years ago. He was also at [facility] for detox 3 days for detox. States that after completing detox and rehab he began drinking within 8 to 10 days of leaving the programs. He denies SI, HI, and AVH's. Patient denies depression but admits to feeling increasingly anxious Reason for Continuation of Hospitalization: Librium taper Mental wellness plan  Estimated length of stay: 2 days  For review of initial/current patient  goals, please see plan of care.  Attendees:  Patient:    Family:    Physician: Geoffery Lyons  10/10/2012 5:28 PM   Nursing: Alease Frame RN  10/10/2012 5:28 PM   Clinical Social Worker Jaleisa Brose Smart, LCSWA  10/10/2012 5:28 PM   Other: Quintella Reichert, RN  10/10/2012 5:28 PM   Other:    Other: Massie Kluver, Community Care Coordinator  10/10/2012 5:28 PM   Other:    Scribe for Treatment Team:  Trula Slade LCSWA 10/10/2012 5:28 PM

## 2012-10-11 ENCOUNTER — Ambulatory Visit (HOSPITAL_COMMUNITY)
Admission: AD | Admit: 2012-10-11 | Discharge: 2012-10-11 | Disposition: A | Discharge status: Home / Self Care | Payer: BC Managed Care – PPO | Source: Intra-hospital | Attending: Psychiatry | Admitting: Psychiatry

## 2012-10-11 NOTE — Progress Notes (Signed)
Recreation Therapy Notes  Date: 06.10.2014 Time: 3:00pm Location: 300 [address] Dayroom      Group Topic/Focus: Decision Making  Participation Level: Active  Participation Quality: Appropriate  Affect: Euthymic  Cognitive: Appropriate   Additional Comments: Activity: List Boat Game ; Explanation: Patients given a scenario about taking a trip on a yacht, 1/2 way through their trip the yacht springs a leak and the patients must return to shore. They can save the group as well as 8 people off the following list: President Obama, Devona Konig, Physician, Nurse, Mechanic, Electrician, [place], Teacher, Pregnant Woman, Ex-Convict, Ex-Marine, [place], [place], [place], [place]. Patients needed to work together to agree on the list of people they would like to save.   Patient actively participated in group activity. Patient was able to give justification and reasoning for the individuals he wanted to save. Patient listened to wrap up discussion about the importance of building a support system to recovery.   Marykay Lex Terrianna H[place]claw, LRT/CTRS  Jearl Klinefelter 10/11/2012 4:38 PM

## 2012-10-11 NOTE — Progress Notes (Signed)
Pt reports his sleep well.  His appetite is good energy normal and ability to pay attention as good.  He rated his  depression a 1 hopelessness 1 and denied any anxiety on his self-inventory.  He denies any S/H ideation or A/V hallucinations.  He went for his x-ray today which Dr. Dub Mikes spoke with pt and told pt he did not have any fracture.  He was given his tennis shoes with laces per md order to help support pt's foot.  Pt now reporting some relief since having his shoes on.

## 2012-10-11 NOTE — Progress Notes (Signed)
[facility] MD Progress Note  10/11/2012 4:31 PM Jared [name]  MRN:  [id number] Subjective:  Jared [name] is having a hard time with his ankle. X rays failed to reveal acute pathology. Will address with antiinflammatory medications, cold etc. He states that he has realized how he allowed the alcohol to become part of his life, and how it became an habit, reinforced by his being able to deal with stress with it. Moving forward he is planning to make some life style changes to have better coping in place.  Diagnosis:  Alcohol Dependence, Substance Induced Mood Disorder  ADL's:  Intact  Sleep: Fair  Appetite:  Fair  Suicidal Ideation:  Plan:  denies Intent:  denies Means:  denies Homicidal Ideation:  Plan:  denies Intent:  denies Means:  denies AEB (as evidenced by):  Psychiatric Specialty Exam: Review of Systems  Constitutional: Negative.   HENT: Negative.   Eyes: Negative.   Respiratory: Negative.   Cardiovascular: Negative.   Gastrointestinal: Negative.   Genitourinary: Negative.   Musculoskeletal: Positive for joint pain.       Ankle pain, swealling  Skin: Negative.   Neurological: Negative.   Endo/Heme/Allergies: Negative.   Psychiatric/Behavioral: Positive for substance abuse. The patient is nervous/anxious.     Blood pressure 104/69, pulse 91, temperature 97.7 F (36.5 C), temperature source Oral, resp. rate 18, height 5\' 10"  (1.778 m), weight 58.514 kg (129 lb).Body mass index is 18.51 kg/(m^2).  General Appearance: Fairly Groomed, leg elevated wheel chair  Eye Contact::  Fair  Speech:  Clear and Coherent  Volume:  Normal  Mood:  worried  Affect:  worried  Thought Process:  Coherent and Goal Directed  Orientation:  Full (Time, Place, and Person)  Thought Content:  worries, concerns  Suicidal Thoughts:  No  Homicidal Thoughts:  No  Memory:  Immediate;   Fair Recent;   Fair Remote;   Fair  Judgement:  Fair  Insight:  Present  Psychomotor Activity:  Restlessness   Concentration:  Fair  Recall:  Fair  Akathisia:  No  Handed:  Right  AIMS (if indicated):     Assets:  Desire for Improvement Housing Social Support Transportation Vocational/Educational  Sleep:  Number of Hours: 6.25   Current Medications: Current Facility-Administered Medications  Medication Dose Route Frequency Provider Last Rate Last Dose  . acetaminophen (TYLENOL) tablet 650 mg  650 mg Oral Q6H PRN Fransisca Kaufmann, NP   650 mg at 10/10/12 0750  . alum & mag hydroxide-simeth (MAALOX/MYLANTA) 200-200-20 MG/5ML suspension 30 mL  30 mL Oral Q4H PRN Fransisca Kaufmann, NP      . chlordiazePOXIDE (LIBRIUM) capsule 25 mg  25 mg Oral Q6H PRN Fransisca Kaufmann, NP      . chlordiazePOXIDE (LIBRIUM) capsule 25 mg  25 mg Oral BH-qamhs Fransisca Kaufmann, NP       Followed by  . [START ON 10/13/2012] chlordiazePOXIDE (LIBRIUM) capsule 25 mg  25 mg Oral Daily Fransisca Kaufmann, NP      . hydrOXYzine (ATARAX/VISTARIL) tablet 25 mg  25 mg Oral Q6H PRN Fransisca Kaufmann, NP      . ibuprofen (ADVIL,MOTRIN) tablet 600 mg  600 mg Oral Q6H PRN Wonda Cerise, MD   600 mg at 10/10/12 1120  . loperamide (IMODIUM) capsule 2-4 mg  2-4 mg Oral PRN Fransisca Kaufmann, NP      . magnesium hydroxide (MILK OF MAGNESIA) suspension 30 mL  30 mL Oral Daily PRN Fransisca Kaufmann, NP      . multivitamin with minerals  tablet 1 tablet  1 tablet Oral Daily Fransisca Kaufmann, NP   1 tablet at 10/11/12 0813  . ondansetron (ZOFRAN-ODT) disintegrating tablet 4 mg  4 mg Oral Q6H PRN Fransisca Kaufmann, NP      . thiamine (VITAMIN B-1) tablet 100 mg  100 mg Oral Daily Fransisca Kaufmann, NP   100 mg at 10/11/12 0813  . traZODone (DESYREL) tablet 100 mg  100 mg Oral QHS PRN Fransisca Kaufmann, NP   100 mg at 10/10/12 2242    Lab Results: No results found for this or any previous visit (from the past 48 hour(s)).  Physical Findings: AIMS: Facial and Oral Movements Muscles of Facial Expression: None, normal Lips and Perioral Area: None, normal Jaw: None, normal Tongue: None, normal,Extremity  Movements Upper (arms, wrists, hands, fingers): None, normal Lower (legs, knees, ankles, toes): None, normal, Trunk Movements Neck, shoulders, hips: None, normal, Overall Severity Severity of abnormal movements (highest score from questions above): None, normal Incapacitation due to abnormal movements: None, normal Patient's awareness of abnormal movements (rate only patient's report): No Awareness, Dental Status Current problems with teeth and/or dentures?: No Does patient usually wear dentures?: No  CIWA:  CIWA-Ar Total: 2 COWS:     Treatment Plan Summary: Daily contact with patient to assess and evaluate symptoms and progress in treatment Medication management  Plan: Supportive approach/coping skills/relapse prevention           Complete the detox           X Ray of ankle  Medical Decision Making Problem Points:  Review of psycho-social stressors (1) Data Points:  Review of medication regiment & side effects (2)  I certify that inpatient services furnished can reasonably be expected to improve the patient's condition.   Hobart Marte A 10/11/2012, 4:31 PM

## 2012-10-11 NOTE — B[facility] Suicide Risk Assessment (Signed)
[facility] INPATIENT: Family/Significant Other Suicide Prevention Education  Suicide Prevention Education:  Education Completed; No one has been identified by the patient as the family member/significant other with whom the patient will be residing, and identified as the person(s) who will aid the patient in the event of a mental health crisis (suicidal ideations/suicide attempt). With written consent from the patient, the family member/significant other has been provided the following suicide prevention education, prior to the and/or following the discharge of the patient.  The suicide prevention education provided includes the following:  Suicide risk factors  Suicide prevention and interventions  National Suicide Hotline telephone number  [facility] assessment telephone number  [facility] Emergency Assistance 911  [facility] and/or Residential Mobile Crisis Unit telephone number Request made of family/significant other to:  Remove weapons (e.g., guns, rifles, knives), all items previously/currently identified as safety concern.  Remove drugs/medications (over-the-counter, prescriptions, illicit drugs), all items previously/currently identified as a safety concern. The family member/significant other verbalizes understanding of the suicide prevention education information provided. The family member/significant other agrees to remove the items of safety concern listed above. Pt did not c/o SI at admission, nor have they endorsed SI during their stay here. SPE not required.   Jared [name], LCSWA 10/11/2012  2:51 PM

## 2012-10-11 NOTE — Progress Notes (Signed)
D: Patient denies SI/HI/AVH. Patient rates hopelessness as 1,  depression as 1, and anxiety as 2.  Patient affect is appropriate. Mood is anxious.  Pt states "With this process, some anxiety is to be expected.  I feel better than I ever would have thought that I would in here.  Things are undoubtedly going to be better on the other side."  Patient did attend evening group. Patient visible on the milieu. No distress noted. A: Support and encouragement offered. Scheduled medications given to pt. Q 15 min checks continued for patient safety. R: Patient receptive. Patient remains safe on the unit.

## 2012-10-11 NOTE — [facility] Group Notes (Signed)
[facility] LCSW Group Therapy  10/11/2012 3:03 PM  Type of Therapy:  Group Therapy  Participation Level:  Active  Participation Quality:  Appropriate  Affect:  Appropriate  Cognitive:  Appropriate  Insight:  Engaged  Engagement in Therapy:  Engaged  Modes of Intervention:  Discussion, Education, Exploration, Socialization and Support  Summary of Progress/Problems: MHA Speaker came to talk about his personal journey with substance abuse and addiction. The pt processed ways by which to relate to the speaker. MHA speaker provided handouts and educational information pertaining to groups and services offered by the [facility]. Jared [name] listened and asked questions as speaker discussed various groups and services offered by [facility]. He was able to process how the speaker's personal story regarding addiction and substance abuse relates to his own struggles and thanked the speaker after group for sharing.    Smart, Jared [name] 10/11/2012, 3:03 PM

## 2012-10-11 NOTE — Progress Notes (Signed)
Recreation Therapy Notes  Date: 06.10.2014 Time: 2:30pm Location: 300 [address] Dayroom      Group Topic/Focus: Musician (AAA/T)  Participation Level: Active  Participation Quality: Appropriate  Affect: Euthymic  Cognitive: Appropriate  Additional Comments: 06.10.2014 Session = AAA Session; Dog Team = [facility] & handler  Patient pet and visited with [name]. Patient asked appropriate questions about [facility] and his training. Patient interacted appropriately with peers, LRT & MHT.   Marykay Lex Jalyn Dutta, LRT/CTRS  Jearl Klinefelter 10/11/2012 3:58 PM

## 2012-10-11 NOTE — BHH Group Notes (Signed)
[facility] LCSW Aftercare Discharge Planning Group Note   10/11/2012 8:45 AM  Participation Quality:  Alert and Appropriate   Mood/Affect:  Appropriate  Depression Rating:  0  Anxiety Rating:  0  Thoughts of Suicide:  Pt denies SI/HI  Will you contract for safety?   Yes  Current AVH:  No  Plan for Discharge/Comments:  Pt attended discharge planning group and actively participated in group.  CSW provided pt with today's workbook.  Pt states that he came to the hospital to detox from alcohol.  Pt states that this is his first detox.  Pt states that he doesn't want to do inpatient treatment after detox because he works and needs to return to work.  Pt wants to follow up at [facility] for CDIOP, which holds groups in the evening.  Pt will return home in [place].  No further needs voiced by pt at this time.    Transportation Means: Pt has access to transportation  Supports: Pt names his wife and family as supports  [place], [place] 10/11/2012 9:59 AM

## 2012-10-11 NOTE — Progress Notes (Signed)
Adult Psychoeducational Group Note  Date:  10/11/2012 Time:  1:16 PM  Group Topic/Focus:  Recovery Goals:   The focus of this group is to identify appropriate goals for recovery and establish a plan to achieve them.  Participation Level:  Active  Participation Quality:  Appropriate  Affect:  Appropriate  Cognitive:  Appropriate  Insight: Appropriate  Engagement in Group:  Engaged  Modes of Intervention:  Discussion  Additional Comments:  Pt attended recovery group and participated with support for peers and ideas of what recovery looks like for him. He spoke of his supportive family and wanting to be a positive example for his children.  Reynolds Bowl 10/11/2012, 1:16 PM

## 2012-10-12 MED ORDER — NALTREXONE HCL 50 MG PO TABS
25.0000 mg | ORAL_TABLET | Freq: Once | ORAL | Status: AC
  Administered 2012-10-12: 25 mg via ORAL
  Filled 2012-10-12 (×2): qty 1

## 2012-10-12 MED ORDER — IBUPROFEN 600 MG PO TABS: 600.0000 mg | ORAL_TABLET | Freq: Four times a day (QID) | ORAL | Status: DC | PRN

## 2012-10-12 MED ORDER — TRAZODONE HCL 100 MG PO TABS: 100.0000 mg | ORAL_TABLET | Freq: Every evening | ORAL | Status: DC | PRN

## 2012-10-12 MED ORDER — NALTREXONE HCL 50 MG PO TABS: 25.0000 mg | ORAL_TABLET | Freq: Once | ORAL | Status: DC

## 2012-10-12 MED ORDER — ADULT MULTIVITAMIN W/MINERALS CH: 1.0000 | ORAL_TABLET | Freq: Every day | ORAL | Status: DC

## 2012-10-12 NOTE — Progress Notes (Signed)
[facility] Adult Case Management Discharge Plan :  Will you be returning to the same living situation after discharge: Yes,  returning home At discharge, do you have transportation home?:Yes,  wife will pick pt up Do you have the ability to pay for your medications:Yes,  access to meds  Release of information consent forms completed and in the chart;  Patient's signature needed at discharge.  Patient to Follow up at: Follow-up Information   Follow up with [facility] On 10/13/2012. (Appointment scheduled at 2:00 pm for intake assessment for CDIOP)    Contact information:   [address] [place], [place] [address] Phone: [phone] Fax: [phone]      Patient denies SI/HI:   Yes,  denies SI/HI    Safety Planning and Suicide Prevention discussed:  Yes,  discussed with pt.  Contact made with pt's wife but no suicide prevention discussed due to pt not being admitted for SI  [place], Salome Arnt 10/12/2012, 10:48 AM

## 2012-10-12 NOTE — Tx Team (Signed)
Interdisciplinary Treatment Plan Update (Adult)  Date: 10/12/2012  Time Reviewed:  9:45 AM  Progress in Treatment: Attending groups: Yes Participating in groups:  Yes Taking medication as prescribed:  Yes Tolerating medication:  Yes Family/Significant othe contact made: Consent given to talk to wife Patient understands diagnosis:  Yes Discussing patient identified problems/goals with staff:  Yes Medical problems stabilized or resolved:  Yes Denies suicidal/homicidal ideation: Yes Issues/concerns per patient self-inventory:  Yes Other:  New problem(s) identified: N/A  Discharge Plan or Barriers: Pt will follow up at [facility] for medication management and therapy.    Reason for Continuation of Hospitalization: Stable to d/c  Comments: N/A  Estimated length of stay: D/C today  For review of initial/current patient goals, please see plan of care.  Attendees: Patient:     Family:     Physician:  Dr. Dub Mikes 10/12/2012 9:59 AM   Nursing:   Nestor Ramp, RN 10/12/2012 9:59 AM   Clinical Social Worker:  Reyes Ivan, LCSWA 10/12/2012 9:59 AM   Other: Robbie Louis, RN 10/12/2012 9:59 AM   Other:  Trula Slade, LCSWA 10/12/2012 9:59 AM   Other:     Other:     Other:    Other:    Other:    Other:    Other:    Other:     Scribe for Treatment Team:   Carmina Miller, 10/12/2012 , 9:59 AM Interdisciplinary Treatment Plan Update (Adult)  Date: 10/12/2012  Time Reviewed:  9:45 AM  Progress in Treatment: Attending groups: Yes Participating in groups:  Yes Taking medication as prescribed:  Yes Tolerating medication:  Yes Family/Significant othe contact made: Consent given to talk to wife Patient understands diagnosis:  Yes Discussing patient identified problems/goals with staff:  Yes Medical problems stabilized or resolved:  Yes Denies suicidal/homicidal ideation: Yes Issues/concerns per patient self-inventory:  Yes Other:  New problem(s) identified: N/A  Discharge Plan  or Barriers: Pt will follow up at [facility] for CDIOP.    Reason for Continuation of Hospitalization: Stable to d/c  Comments: N/A  Estimated length of stay: D/C today  For review of initial/current patient goals, please see plan of care.  Attendees: Patient:     Family:     Physician:  Dr. Dub Mikes 10/12/2012 9:59 AM   Nursing:   Nestor Ramp, RN 10/12/2012 9:59 AM   Clinical Social Worker:  Reyes Ivan, LCSWA 10/12/2012 9:59 AM   Other: Robbie Louis, RN 10/12/2012 9:59 AM   Other:  Trula Slade, LCSWA 10/12/2012 9:59 AM   Other:     Other:     Other:    Other:    Other:    Other:    Other:    Other:     Scribe for Treatment Team:   Carmina Miller, 10/12/2012 , 9:59 AM

## 2012-10-12 NOTE — Progress Notes (Signed)
Pt was discharged home today.  He denied any S/I H/I or A/V hallucinations.    He was given f/u appointment, rx, sample medications, hotline info booklet, and letter provided by the case manager.  He voiced understanding to all instructions provided.  He declined the need for smoking cessation materials.  

## 2012-10-12 NOTE — Progress Notes (Signed)
Adult Psychoeducational Group Note  Date:  10/12/2012 Time:  2:09 PM  Group Topic/Focus:  Personal Choices and Values:   The focus of this group is to help patients assess and explore the importance of values in their lives, how their values affect their decisions, how they express their values and what opposes their expression.  Participation Level:  Active  Participation Quality:  Appropriate  Affect:  Appropriate  Cognitive:  Alert and Appropriate  Insight: Appropriate  Engagement in Group:  Engaged  Modes of Intervention:  Discussion  Additional Comments:  Pt participated and appropriate in group. He identified being heathy and physically fit as an important value to him. "I want to eat right and exercise". Pt displayed a positive outlook and was encouraging to others.  Reynolds Bowl 10/12/2012, 2:09 PM

## 2012-10-12 NOTE — Discharge Summary (Signed)
Physician Discharge Summary Note  Patient:  Jared [name] is an 47 y.o., male MRN:  [id number] DOB:  December 26, 1965 Patient phone:  [phone] (home)  Patient address:   [address] [place] [address],   Date of Admission:  10/08/2012 Date of Discharge: 10/12/12  Reason for Admission:  Alcohol intoxication  Discharge Diagnoses: Active Problems:   Alcohol dependence   Substance induced mood disorder  Review of Systems  Constitutional: Negative.   HENT: Negative.   Eyes: Negative.   Respiratory: Negative.   Cardiovascular: Negative.   Gastrointestinal: Negative.   Genitourinary: Negative.   Musculoskeletal: Positive for myalgias and falls (Hx of).  Skin: Negative.   Neurological: Negative.   Endo/Heme/Allergies: Negative.   Psychiatric/Behavioral: Positive for suicidal ideas and substance abuse (Hx alcoholism). Negative for depression, hallucinations and memory loss. The patient has insomnia (Stabilized with medication prior to discharge). The patient is not nervous/anxious.    Axis Diagnosis:   AXIS I:  Substance Induced Mood Disorder and  Alcohol dependence AXIS II:  Deferred AXIS III:   Past Medical History  Diagnosis Date  . Alcohol abuse    AXIS IV:  other psychosocial or environmental problems and Substance abuse AXIS V:  63  Level of Care:  OP  Hospital Course:  This is a 47 year old Caucasian male. Admitted to [facility] from the [facility] with complaints of excessive use of alcohol requesting detoxification treatment. Patient reports, "My son dropped me off to the hospital by my request. I needed a kick start to stop drinking alcohol for good. I have been drinking alcohol on a regular basis throughout my adult life, and heavily x 17 years. I'm an alcoholic. I have tried to stop drinking in the past, but has not been successful. I have been sober once for about a month, and that was 10 years ago. Then about 8 years ago, I was sober for about 5  months as well, then relapsed. Alcohol in fact relaxes me and calms my mind. At this point in my life, I would say drinking alcohol has become an old habit for me. I received DUI many years ago. No pending charges against me at this point. Alcoholism did infact affect my relationships over the years. I don't believe that I'm depressed, just a little anxious".  After admission assessment and evaluation, it was determined that Mr. Jared [name] was intoxicated and will need detoxification treatment to stabilize his system of alcohol intoxication and to combat the withdrawal symptoms of alcohol as well. And his discharge plans included a referral to an outpatient treatment center [facility]) for a more intense substance abuse treatment. Mr. Jared [name] was then started on Librium protocol for his alcohol detoxification. He was also enrolled in group counseling sessions and activities where he was counseled and learned coping skills that should help him after discharge to cope better, manage his substance abuse problems to maintain a much longer sobriety.   Besides the detoxification protocol, patient also received Trazodone 100 mg at bedtime for sleep . He also was enrolled and attended AA/NA meetings being offered and held on this unit. He has no previous and or identifiable medical conditions that required treatment and or monitoring. However, he did sustain a non life threatening fall while in this hospital, he did complain of pain to right. However, an xray report indicated no serious injuriesand or fractures. He did received medication management (Ibuprofen 600 mg ) on prn basis for pain. Mr. Jared [name] was  monitored closely for any potential problems that may arise as a result of result of and or during detoxification treatment. Patient tolerated his treatment regimen and detoxification treatment without any significant adverse effects and or reactions reported.  Patient attended treatment team meeting this am and  met with the treatment team members. His reason for admission, present symptoms, substance abuse issues, response to treatment and discharge plans discussed. Patient endorsed that he is doing well and stable for discharge to pursue the next phase of his substance abuse treatment. It was agreed upon that he will continue substance abuse treatment at the [facility] treatment center on 10/13/12 at 02:00 pm. The address, date, time and contact information for the [facility] provided for patient in writing. In addition to this, Mr. Jared [name] is encouraged to join/attend AA/NA meetings being offered and held within his community. Marland Kitchen   Upon discharge, patient adamantly denies suicidal, homicidal ideations, auditory, visual hallucinations, delusional thinking and or withdrawal symptoms. Patient left [facility] with all personal belongings in no apparent distress. He received 2 weeks worth supply samples of his [facility] discharge medications. Transportation per family.   Consults:  psychiatry  Significant Diagnostic Studies:  labs: CBC with diff, CMP, UDS, Toxicology tests, U/A  Discharge Vitals:   Blood pressure 102/71, pulse 94, temperature 97.7 F (36.5 C), temperature source Oral, resp. rate 16, height 5\' 10"  (1.778 m), weight 58.514 kg (129 lb). Body mass index is 18.51 kg/(m^2). Lab Results:   No results found for this or any previous visit (from the past 72 hour(s)).  Physical Findings: AIMS: Facial and Oral Movements Muscles of Facial Expression: None, normal Lips and Perioral Area: None, normal Jaw: None, normal Tongue: None, normal,Extremity Movements Upper (arms, wrists, hands, fingers): None, normal Lower (legs, knees, ankles, toes): None, normal, Trunk Movements Neck, shoulders, hips: None, normal, Overall Severity Severity of abnormal movements (highest score from questions above): None, normal Incapacitation due to abnormal movements: None, normal Patient's awareness of abnormal movements  (rate only patient's report): No Awareness, Dental Status Current problems with teeth and/or dentures?: No Does patient usually wear dentures?: No  CIWA:  CIWA-Ar Total: 0 COWS:     Psychiatric Specialty Exam: See Psychiatric Specialty Exam and Suicide Risk Assessment completed by Attending Physician prior to discharge.  Discharge destination:  Home  Is patient on multiple antipsychotic therapies at discharge:  No   Has Patient had three or more failed trials of antipsychotic monotherapy by history:  No  Recommended Plan for Multiple Antipsychotic Therapies: NA     Medication List    TAKE these medications     Indication   ibuprofen 600 MG tablet  Commonly known as:  ADVIL,MOTRIN  Take 1 tablet (600 mg total) by mouth every 6 (six) hours as needed. For pain to right foot areas   Indication:  Inflammation, pain     multivitamin with minerals Tabs  Take 1 tablet by mouth daily. For low vitamin   Indication:  Low vitamin     naltrexone 50 MG tablet  Commonly known as:  DEPADE  Take 0.5 tablets (25 mg total) by mouth once. For alcoholism   Indication:  Excessive Use of Alcohol     traZODone 100 MG tablet  Commonly known as:  DESYREL  Take 1 tablet (100 mg total) by mouth at bedtime as needed for sleep. For sleep   Indication:  Trouble Sleeping       Follow-up Information   Follow up with [facility] On  10/13/2012. (Appointment scheduled at 2:00 pm for intake assessment for CDIOP)    Contact information:   [address] [place] [address], [place] [address] Phone: [phone] Fax: [phone]     Follow-up recommendations:  Activity:  As tolerated Diet: As recommended by your primary care doctor. Keep all scheduled follow-up appointments as recommended. Continue to work the relapse prevention plan Comments: Take all your medications as prescribed by your mental healthcare provider. Report any adverse effects and or reactions from your medicines to your outpatient  provider promptly. Patient is instructed and cautioned to not engage in alcohol and or illegal drug use while on prescription medicines. In the event of worsening symptoms, patient is instructed to call the crisis hotline, 911 and or go to the nearest ED for appropriate evaluation and treatment of symptoms. Follow-up with your primary care provider for your other medical issues, concerns and or health care needs.    Total Discharge Time:  Greater than 30 minutes.  Signed: Sanjuana Kava, PMHNP-BC 10/12/2012, 11:51 AM Agree with assessment and plan Reymundo Poll. Dub Mikes, M.D.

## 2012-10-12 NOTE — BHH Suicide Risk Assessment (Signed)
Suicide Risk Assessment  Discharge Assessment     Demographic Factors:  Male and Caucasian  Mental Status Per Nursing Assessment::   On Admission:   (none)  Current Mental Status by Physician: In full contact with reality. There are no suicidal ideas, plans or intent. His mood is euthymic. His affect is appropriate. He states he is committed to abstinence. States he feels much better. He is going to follow up with the [facility].    Loss Factors: NA  Historical Factors: NA  Risk Reduction Factors:   Sense of responsibility to family, Employed, Living with another person, especially a relative and Positive social support  Continued Clinical Symptoms:  Alcohol/Substance Abuse/Dependencies  Cognitive Features That Contribute To Risk: None identified    Suicide Risk:  Minimal: No identifiable suicidal ideation.  Patients presenting with no risk factors but with morbid ruminations; may be classified as minimal risk based on the severity of the depressive symptoms  Discharge Diagnoses:   AXIS I:  Alcohol Dependence, Substance Induced Mood Disorder AXIS II:  Deferred AXIS III:   Past Medical History  Diagnosis Date  . Alcohol abuse    AXIS IV:  none identified AXIS V:  61-70 mild symptoms  Plan Of Care/Follow-up recommendations:  Activity:  as tolerated Diet:  regular Follow up [facility] Is patient on multiple antipsychotic therapies at discharge:  No   Has Patient had three or more failed trials of antipsychotic monotherapy by history:  No  Recommended Plan for Multiple Antipsychotic Therapies: N/A   Jared [name] A 10/12/2012, 12:27 PM

## 2012-10-12 NOTE — Progress Notes (Signed)
Patient ID: Jared [name], male   DOB: [birth year], 47 y.o.   MRN: [id number]  D: Pt denies SI/HI/AVH. Pt is pleasant and cooperative. Pt soaked foot in warm tub then applied ice pack. Pt states "i had a good day". Pt in good mood all night.   A: Pt was offered support and encouragement. Pt was given scheduled medications. Pt was encourage to attend groups. Q 15 minute checks were done for safety.   R:Pt attends groups and interacts well with peers and staff. Pt is taking medication. Pt has no complaints at this time.Pt receptive to treatment and safety maintained on unit. Pt states the warmth/ ice helped foot/ ankle.

## 2012-10-12 NOTE — BHH Group Notes (Signed)
[facility] LCSW Aftercare Discharge Planning Group Note   10/12/2012 8:45 AM  Participation Quality:  Alert and Appropriate   Mood/Affect:  Appropriate  Depression Rating:  0  Anxiety Rating:  0  Thoughts of Suicide:  Pt denies SI/HI  Will you contract for safety?   Yes  Current AVH:  No  Plan for Discharge/Comments:  Pt attended discharge planning group and actively participated in group.  CSW provided pt with today's workbook.  Pt reports feeling stable to d/c today.  Pt will follow up at [facility] for CDIOP.  No further needs voiced by pt at this time.    Transportation Means: Pt states that his wife will pick him up today.  Supports: Pt names his family and wife as supports.  Reyes Ivan, LCSWA 10/12/2012 10:18 AM

## 2012-10-17 NOTE — Progress Notes (Signed)
Patient Discharge Instructions:  After Visit Summary (AVS):   Faxed to:  10/17/12 Discharge Summary Note:   Faxed to:  10/17/12 Psychiatric Admission Assessment Note:   Faxed to:  10/17/12 Suicide Risk Assessment - Discharge Assessment:   Faxed to:  10/17/12 Faxed/Sent to the Next Level Care provider:  10/17/12 Faxed to [facility] @ [phone]  Jerelene Redden, 10/17/2012, 3:50 PM

## 2012-10-20 NOTE — ED Provider Notes (Signed)
Medical screening examination/treatment/procedure(s) were performed by non-physician practitioner and as supervising physician I was immediately available for consultation/collaboration.   Nelia Shi, MD 10/20/12 [phone]

## 2013-11-11 ENCOUNTER — Emergency Department (HOSPITAL_COMMUNITY): Payer: BC Managed Care – PPO

## 2013-11-11 ENCOUNTER — Encounter (HOSPITAL_COMMUNITY): Payer: Self-pay | Admitting: Emergency Medicine

## 2013-11-11 ENCOUNTER — Inpatient Hospital Stay (HOSPITAL_COMMUNITY)
Admission: EM | Admit: 2013-11-11 | Discharge: 2013-11-13 | DRG: 200 | Disposition: A | Discharge status: Home / Self Care | Payer: BC Managed Care – PPO | Attending: General Surgery | Admitting: General Surgery

## 2013-11-11 DIAGNOSIS — S2239XA Fracture of one rib, unspecified side, initial encounter for closed fracture: Secondary | ICD-10-CM

## 2013-11-11 DIAGNOSIS — Z87891 Personal history of nicotine dependence: Secondary | ICD-10-CM

## 2013-11-11 DIAGNOSIS — F1020 Alcohol dependence, uncomplicated: Secondary | ICD-10-CM | POA: Diagnosis present

## 2013-11-11 DIAGNOSIS — S272XXA Traumatic hemopneumothorax, initial encounter: Secondary | ICD-10-CM

## 2013-11-11 DIAGNOSIS — S270XXA Traumatic pneumothorax, initial encounter: Principal | ICD-10-CM

## 2013-11-11 DIAGNOSIS — J939 Pneumothorax, unspecified: Secondary | ICD-10-CM

## 2013-11-11 DIAGNOSIS — E871 Hypo-osmolality and hyponatremia: Secondary | ICD-10-CM | POA: Diagnosis present

## 2013-11-11 DIAGNOSIS — W1809XA Striking against other object with subsequent fall, initial encounter: Secondary | ICD-10-CM | POA: Diagnosis present

## 2013-11-11 DIAGNOSIS — S2232XA Fracture of one rib, left side, initial encounter for closed fracture: Secondary | ICD-10-CM

## 2013-11-11 DIAGNOSIS — W19XXXA Unspecified fall, initial encounter: Secondary | ICD-10-CM

## 2013-11-11 DIAGNOSIS — F10920 Alcohol use, unspecified with intoxication, uncomplicated: Secondary | ICD-10-CM

## 2013-11-11 DIAGNOSIS — T797XXA Traumatic subcutaneous emphysema, initial encounter: Secondary | ICD-10-CM | POA: Diagnosis present

## 2013-11-11 LAB — BASIC METABOLIC PANEL
Anion gap: 16 — ABNORMAL HIGH (ref 5–15)
BUN: 5 mg/dL — ABNORMAL LOW (ref 6–23)
CALCIUM: 8.4 mg/dL (ref 8.4–10.5)
CHLORIDE: 92 meq/L — AB (ref 96–112)
CO2: 23 meq/L (ref 19–32)
Creatinine, Ser: 0.46 mg/dL — ABNORMAL LOW (ref 0.50–1.35)
GFR calc Af Amer: >90 mL/min (ref >90)
GFR calc non Af Amer: >90 mL/min (ref >90)
GLUCOSE: 99 mg/dL (ref 70–99)
Potassium: 3.8 mEq/L (ref 3.7–5.3)
SODIUM: 131 meq/L — AB (ref 137–147)

## 2013-11-11 LAB — CBC
HCT: 38.3 % — ABNORMAL LOW (ref 39.0–52.0)
HEMOGLOBIN: 13.9 g/dL (ref 13.0–17.0)
MCH: 33.7 pg (ref 26.0–34.0)
MCHC: 36.3 g/dL — ABNORMAL HIGH (ref 30.0–36.0)
MCV: 92.7 fL (ref 78.0–100.0)
Platelets: 218 10*3/uL (ref 150–400)
RBC: 4.13 MIL/uL — AB (ref 4.22–5.81)
RDW: 13.2 % (ref 11.5–15.5)
WBC: 11 10*3/uL — AB (ref 4.0–10.5)

## 2013-11-11 LAB — PROTIME-INR
INR: 0.97 (ref 0.00–1.49)
Prothrombin Time: 12.9 seconds (ref 11.6–15.2)

## 2013-11-11 LAB — MRSA PCR SCREENING: MRSA by PCR: NEGATIVE

## 2013-11-11 LAB — APTT: APTT: 25 s (ref 24–37)

## 2013-11-11 LAB — ETHANOL: ALCOHOL ETHYL (B): 294 mg/dL — AB (ref 0–11)

## 2013-11-11 MED ORDER — KCL IN DEXTROSE-NACL 20-5-0.45 MEQ/L-%-% IV SOLN
INTRAVENOUS | Status: DC
  Administered 2013-11-11: 05:00:00 via INTRAVENOUS
  Filled 2013-11-11 (×3): qty 1000

## 2013-11-11 MED ORDER — ACETAMINOPHEN 325 MG PO TABS
650.0000 mg | ORAL_TABLET | ORAL | Status: DC | PRN
  Administered 2013-11-11: 650 mg via ORAL
  Administered 2013-11-11: 325 mg via ORAL
  Filled 2013-11-11 (×2): qty 2

## 2013-11-11 MED ORDER — HYDROMORPHONE HCL PF 1 MG/ML IJ SOLN
1.0000 mg | INTRAMUSCULAR | Status: DC | PRN
Start: 2013-11-11 — End: 2013-11-13
  Administered 2013-11-11: 2 mg via INTRAVENOUS
  Administered 2013-11-11 – 2013-11-12 (×3): 1 mg via INTRAVENOUS
  Administered 2013-11-13: 2 mg via INTRAVENOUS
  Filled 2013-11-11 (×2): qty 1
  Filled 2013-11-11: qty 2
  Filled 2013-11-11: qty 1
  Filled 2013-11-11: qty 2

## 2013-11-11 MED ORDER — HYDROMORPHONE HCL PF 1 MG/ML IJ SOLN
1.0000 mg | INTRAMUSCULAR | Status: DC | PRN
  Administered 2013-11-11: 1 mg via INTRAVENOUS
  Filled 2013-11-11: qty 1

## 2013-11-11 MED ORDER — OXYCODONE HCL 5 MG PO TABS
10.0000 mg | ORAL_TABLET | ORAL | Status: DC | PRN
  Administered 2013-11-11 – 2013-11-12 (×4): 10 mg via ORAL
  Filled 2013-11-11 (×4): qty 2

## 2013-11-11 MED ORDER — LORAZEPAM 2 MG/ML IJ SOLN: 2.0000 mg | INTRAMUSCULAR | Status: DC | PRN

## 2013-11-11 MED ORDER — ONDANSETRON HCL 4 MG PO TABS: 4.0000 mg | ORAL_TABLET | Freq: Four times a day (QID) | ORAL | Status: DC | PRN

## 2013-11-11 MED ORDER — FOLIC ACID 1 MG PO TABS
1.0000 mg | ORAL_TABLET | Freq: Every day | ORAL | Status: DC
  Administered 2013-11-11 – 2013-11-13 (×3): 1 mg via ORAL
  Filled 2013-11-11 (×3): qty 1

## 2013-11-11 MED ORDER — LORAZEPAM 1 MG PO TABS: 0.0000 mg | ORAL_TABLET | Freq: Two times a day (BID) | ORAL | Status: DC

## 2013-11-11 MED ORDER — LORAZEPAM 2 MG/ML IJ SOLN: 1.0000 mg | Freq: Four times a day (QID) | INTRAMUSCULAR | Status: DC | PRN

## 2013-11-11 MED ORDER — ENOXAPARIN SODIUM 40 MG/0.4ML ~~LOC~~ SOLN
40.0000 mg | SUBCUTANEOUS | Status: DC
  Administered 2013-11-11 – 2013-11-12 (×2): 40 mg via SUBCUTANEOUS
  Filled 2013-11-11 (×3): qty 0.4

## 2013-11-11 MED ORDER — PANTOPRAZOLE SODIUM 40 MG PO TBEC
40.0000 mg | DELAYED_RELEASE_TABLET | Freq: Every day | ORAL | Status: DC
  Administered 2013-11-11 – 2013-11-13 (×3): 40 mg via ORAL
  Filled 2013-11-11 (×3): qty 1

## 2013-11-11 MED ORDER — MIDAZOLAM HCL 2 MG/2ML IJ SOLN
2.0000 mg | Freq: Once | INTRAMUSCULAR | Status: AC
  Administered 2013-11-11: 2 mg via INTRAVENOUS

## 2013-11-11 MED ORDER — LORAZEPAM 1 MG PO TABS: 1.0000 mg | ORAL_TABLET | Freq: Four times a day (QID) | ORAL | Status: DC | PRN

## 2013-11-11 MED ORDER — ADULT MULTIVITAMIN W/MINERALS CH
1.0000 | ORAL_TABLET | Freq: Every day | ORAL | Status: DC
  Administered 2013-11-11 – 2013-11-13 (×3): 1 via ORAL
  Filled 2013-11-11 (×3): qty 1

## 2013-11-11 MED ORDER — ONDANSETRON HCL 4 MG/2ML IJ SOLN
4.0000 mg | Freq: Four times a day (QID) | INTRAMUSCULAR | Status: DC | PRN
  Administered 2013-11-11 (×2): 4 mg via INTRAVENOUS
  Filled 2013-11-11 (×2): qty 2

## 2013-11-11 MED ORDER — SODIUM CHLORIDE 0.9 % IV BOLUS (SEPSIS)
500.0000 mL | Freq: Once | INTRAVENOUS | Status: AC
  Administered 2013-11-11: 13:00:00 via INTRAVENOUS

## 2013-11-11 MED ORDER — OXYCODONE HCL 5 MG PO TABS
5.0000 mg | ORAL_TABLET | ORAL | Status: DC | PRN
  Administered 2013-11-11 – 2013-11-13 (×4): 5 mg via ORAL
  Filled 2013-11-11 (×4): qty 1

## 2013-11-11 MED ORDER — FENTANYL CITRATE 0.05 MG/ML IJ SOLN
100.0000 ug | Freq: Once | INTRAMUSCULAR | Status: AC
  Administered 2013-11-11: 100 ug via INTRAVENOUS

## 2013-11-11 MED ORDER — PANTOPRAZOLE SODIUM 40 MG IV SOLR
40.0000 mg | Freq: Every day | INTRAVENOUS | Status: DC
  Filled 2013-11-11 (×3): qty 40

## 2013-11-11 MED ORDER — THIAMINE HCL 100 MG/ML IJ SOLN
100.0000 mg | Freq: Every day | INTRAMUSCULAR | Status: DC
  Filled 2013-11-11 (×3): qty 1

## 2013-11-11 MED ORDER — LORAZEPAM 1 MG PO TABS: 0.0000 mg | ORAL_TABLET | Freq: Four times a day (QID) | ORAL | Status: DC

## 2013-11-11 MED ORDER — FENTANYL CITRATE 0.05 MG/ML IJ SOLN
50.0000 ug | Freq: Once | INTRAMUSCULAR | Status: AC
  Administered 2013-11-11: 50 ug via INTRAVENOUS
  Filled 2013-11-11: qty 2

## 2013-11-11 MED ORDER — MIDAZOLAM HCL 2 MG/2ML IJ SOLN
INTRAMUSCULAR | Status: AC
  Filled 2013-11-11: qty 4

## 2013-11-11 MED ORDER — FENTANYL CITRATE 0.05 MG/ML IJ SOLN
INTRAMUSCULAR | Status: AC
  Filled 2013-11-11: qty 2

## 2013-11-11 MED ORDER — SODIUM CHLORIDE 0.9 % IV BOLUS (SEPSIS)
500.0000 mL | Freq: Once | INTRAVENOUS | Status: AC
  Administered 2013-11-11: 500 mL via INTRAVENOUS

## 2013-11-11 MED ORDER — SODIUM CHLORIDE 0.9 % IV BOLUS (SEPSIS)
1000.0000 mL | Freq: Once | INTRAVENOUS | Status: AC
  Administered 2013-11-11: 1000 mL via INTRAVENOUS

## 2013-11-11 MED ORDER — VITAMIN B-1 100 MG PO TABS
100.0000 mg | ORAL_TABLET | Freq: Every day | ORAL | Status: DC
  Administered 2013-11-11 – 2013-11-13 (×3): 100 mg via ORAL
  Filled 2013-11-11 (×3): qty 1

## 2013-11-11 NOTE — ED Notes (Signed)
Pt brought to ER by [facility] after fall on his house, pt were drinking alcohol around 12 cans today, got anxious and jump on his balcony and fail on the concrete ground where he lie down on his left side until EMS arrive. VSS for EMS pt has absent of Left breath sound, right side is  Clear, pt O2 saturation 100% on 4L [place]

## 2013-11-11 NOTE — ED Provider Notes (Signed)
CSN: [id number]     Arrival date & time 11/11/13  0127 History   First MD Initiated Contact with Patient 11/11/13 0135     Chief Complaint  Patient presents with  . Fall  . Chest Injury     (Consider location/radiation/quality/duration/timing/severity/associated sxs/prior Treatment) HPI  This patient is a generally healthy middle-aged man who is intoxicated after drinking about 12 cans of beer this evening. The patient says that he jumped onto his belt and he then, fell from that height of couple of feet onto the ground. He landed on his left side. He complains of aching, left-sided chest pain which is worse with deep inspiration and movement of the torso.  He was transported to the emergency department from his home by EMS. Paramedics noticed diminished breath sounds on the left along with crepitus.  The patient describes his pain as 8 on a 0-to-10 scale. He denies feeling short of breath. He denies pain or injury to any other region.  Past Medical History  Diagnosis Date  . Alcohol abuse    Past Surgical History  Procedure Laterality Date  . Lymph node removal     Family History  Problem Relation Age of Onset  . Cancer Mother   . Alzheimer's disease Father    History  Substance Use Topics  . Smoking status: Former Games developermoker  . Smokeless tobacco: Former NeurosurgeonUser  . Alcohol Use: 3.6 oz/week    6 Cans of beer per week     Comment: daily    Review of Systems  Ten point review of symptoms performed and is negative with the exception of symptoms noted above.   Allergies  Review of patient's allergies indicates no known allergies.  Home Medications   Prior to Admission medications   Not on File   BP 103/59  Pulse 66  Temp(Src) 97.7 F (36.5 C) (Oral)  Resp 10  Ht 5\' 11"  (1.803 m)  Wt 129 lb (58.514 kg)  BMI 18.00 kg/m2  SpO2 100% Physical Exam  Gen: well developed and well nourished appearing Head: NCAT Eyes: PERL, EOMI Nose: no epistaixis or  rhinorrhea Mouth/throat: mucosa is moist and pink Neck: Cervical spine tenderness Chest wall: diffuse crepitus over the upper anterior left chest wall, ttp over the lower left ribs Lungs: CTA B, no wheezing, rhonchi or rales CV: rapid and regular, no murmur, good distal pulses.  Abd: soft, notender, nondistended Back: diffuse crepitus over the left upper and mid chest. no ttp, no cva ttp Skin: warm and dry Ext:  No ttp, FrOM without pain x 4 ext, no edema, normal to inspection Neuro: CN ii-xii grossly intact, no focal deficits, mildly slurred speec Psyche; the patient appears intoxicated, otherwise normal affect,  calm and cooperative.  ED Course  Procedures (including critical care time) Labs Review   Results for orders placed during the hospital encounter of 11/11/13 (from the past 24 hour(s))  CBC     Status: Abnormal   Collection Time    11/11/13  2:16 AM      Result Value Ref Range   WBC 11.0 (*) 4.0 - 10.5 K/uL   RBC 4.13 (*) 4.22 - 5.81 MIL/uL   Hemoglobin 13.9  13.0 - 17.0 g/dL   HCT 40.938.3 (*) 81.139.0 - 91.452.0 %   MCV 92.7  78.0 - 100.0 fL   MCH 33.7  26.0 - 34.0 pg   MCHC 36.3 (*) 30.0 - 36.0 g/dL   RDW 78.213.2  95.611.5 - 21.315.5 %  Platelets 218  150 - 400 K/uL  BASIC METABOLIC PANEL     Status: Abnormal   Collection Time    11/11/13  2:16 AM      Result Value Ref Range   Sodium 131 (*) 137 - 147 mEq/L   Potassium 3.8  3.7 - 5.3 mEq/L   Chloride 92 (*) 96 - 112 mEq/L   CO2 23  19 - 32 mEq/L   Glucose, Bld 99  70 - 99 mg/dL   BUN 5 (*) 6 - 23 mg/dL   Creatinine, Ser 1.61 (*) 0.50 - 1.35 mg/dL   Calcium 8.4  8.4 - 09.6 mg/dL   GFR calc non Af Amer >90  >90 mL/min   GFR calc Af Amer >90  >90 mL/min   Anion gap 16 (*) 5 - 15  ETHANOL     Status: Abnormal   Collection Time    11/11/13  2:16 AM      Result Value Ref Range   Alcohol, Ethyl (B) 294 (*) 0 - 11 mg/dL  PROTIME-INR     Status: None   Collection Time    11/11/13  2:16 AM      Result Value Ref Range    Prothrombin Time 12.9  11.6 - 15.2 seconds   INR 0.97  0.00 - 1.49  APTT     Status: None   Collection Time    11/11/13  2:16 AM      Result Value Ref Range   aPTT 25  24 - 37 seconds    Imaging Review Ct Chest Wo Contrast  11/11/2013   CLINICAL DATA:  Fall. Shortness of breath. Left-sided chest pain. Chest injury.  EXAM: CT CHEST WITHOUT CONTRAST  TECHNIQUE: Multidetector CT imaging of the chest was performed following the standard protocol without IV contrast.  COMPARISON:  Chest x-ray 11/11/2013  FINDINGS: Subcutaneous emphysema along the left side of the neck and extending into the left chest anteriorly and posteriorly as well as into the left shoulder. Pneumomediastinum. Small left pneumothorax. Airspace disease in both lung bases, greater on the left, possibly indicating atelectasis or contusion. The heart size and pulmonary vascularity appear normal. Esophagus is decompressed. No significant lymphadenopathy in the chest. Visualized portions of the upper abdominal organs appear grossly unremarkable. Nondisplaced acute fracture of the left posterior lateral tenth rib. No additional rib fractures are appreciated. Sternum and thoracic spine appear intact.  IMPRESSION: Nondisplaced fracture of the left posterior lateral tenth rib. Extensive subcutaneous emphysema in the left chest and neck. Pneumomediastinum. Small left pneumothorax. Contusion or atelectasis in the left lung base.   Electronically Signed   By: Burman Nieves M.D.   On: 11/11/2013 02:55   Dg Chest Port 1 View  11/11/2013   CLINICAL DATA:  Trauma.  Fall.  Left rib pain.  EXAM: PORTABLE CHEST - 1 VIEW  COMPARISON:  None.  FINDINGS: Moderately prominent subcutaneous emphysema along the left chest wall and left neck. Linear lucencies in the left side of the mediastinum consistent with pneumomediastinum. Small left apical pneumothorax. Nondisplaced fracture demonstrated in the left anterolateral eighth rib and probably also in the  seventh and ninth ribs. Pulmonary hyperinflation may suggest underlying emphysematous change. Heart size and pulmonary vascularity are normal. No focal consolidation or collapse in the lungs.  IMPRESSION: Left rib fractures with subcutaneous emphysema, small left pneumothorax, and pneumomediastinum.  These results were called by telephone at the time of interpretation on 11/11/2013 at 2:01 AM to Dr. Brandt Loosen , who  verbally acknowledged these results.   Electronically Signed   By: Burman Nieves M.D.   On: 11/11/2013 02:05      MDM   Final diagnoses:  Rib fracture, left, closed, initial encounter  Pneumothorax on left  Alcohol intoxication, uncomplicated   Case discussed with Dr. Janee Morn of [facility]. He will see and admit the patient. We are treating with alalgesia, IVF and supplemental 02.     Brandt Loosen, MD 11/11/13 [phone]

## 2013-11-11 NOTE — H&P (Signed)
Jared [name] is an 48 y.o. male.   Chief Complaint: Left posterior rib pain, shortness of breath HPI: Patient was on his deck earlier tonight when he was getting ready to go inside. He fell backwards striking his left posterior chest. He had significant pain in that area associated with shortness of breath. His wife called EMS and he was transported to the emergency department. He was not a trauma code activation. He no loss of consciousness at the scene. He denies other complaints or injuries. Workup in the emergency department included chest x-ray and CT scan of the chest both demonstrating left posterior 10th rib FX with small pneumothorax and extensive subcutaneous emphysema. I was asked to see him formation to the trauma service.  Past Medical History  Diagnosis Date  . Alcohol abuse     Past Surgical History  Procedure Laterality Date  . Lymph node removal      Family History  Problem Relation Age of Onset  . Cancer Mother   . Alzheimer's disease Father    Social History:  reports that he has quit smoking. He has quit using smokeless tobacco. He reports that he drinks about 3.6 ounces of alcohol per week. He reports that he does not use illicit drugs.  Allergies: No Known Allergies   (Not in a hospital admission)  Results for orders placed during the hospital encounter of 11/11/13 (from the past 48 hour(s))  CBC     Status: Abnormal   Collection Time    11/11/13  2:16 AM      Result Value Ref Range   WBC 11.0 (*) 4.0 - 10.5 K/uL   RBC 4.13 (*) 4.22 - 5.81 MIL/uL   Hemoglobin 13.9  13.0 - 17.0 g/dL   HCT 38.3 (*) 39.0 - 52.0 %   MCV 92.7  78.0 - 100.0 fL   MCH 33.7  26.0 - 34.0 pg   MCHC 36.3 (*) 30.0 - 36.0 g/dL   RDW 13.2  11.5 - 15.5 %   Platelets 218  150 - 400 K/uL  BASIC METABOLIC PANEL     Status: Abnormal   Collection Time    11/11/13  2:16 AM      Result Value Ref Range   Sodium 131 (*) 137 - 147 mEq/L   Potassium 3.8  3.7 - 5.3 mEq/L   Chloride 92 (*)  96 - 112 mEq/L   CO2 23  19 - 32 mEq/L   Glucose, Bld 99  70 - 99 mg/dL   BUN 5 (*) 6 - 23 mg/dL   Creatinine, Ser 0.46 (*) 0.50 - 1.35 mg/dL   Calcium 8.4  8.4 - 10.5 mg/dL   GFR calc non Af Amer >90  >90 mL/min   GFR calc Af Amer >90  >90 mL/min   Comment: (NOTE)     The eGFR has been calculated using the CKD EPI equation.     This calculation has not been validated in all clinical situations.     eGFR's persistently <90 mL/min signify possible Chronic Kidney     Disease.   Anion gap 16 (*) 5 - 15  ETHANOL     Status: Abnormal   Collection Time    11/11/13  2:16 AM      Result Value Ref Range   Alcohol, Ethyl (B) 294 (*) 0 - 11 mg/dL   Comment:            LOWEST DETECTABLE LIMIT FOR     SERUM ALCOHOL IS  11 mg/dL     FOR MEDICAL PURPOSES ONLY  PROTIME-INR     Status: None   Collection Time    11/11/13  2:16 AM      Result Value Ref Range   Prothrombin Time 12.9  11.6 - 15.2 seconds   INR 0.97  0.00 - 1.49  APTT     Status: None   Collection Time    11/11/13  2:16 AM      Result Value Ref Range   aPTT 25  24 - 37 seconds   Ct Chest Wo Contrast  11/11/2013   CLINICAL DATA:  Fall. Shortness of breath. Left-sided chest pain. Chest injury.  EXAM: CT CHEST WITHOUT CONTRAST  TECHNIQUE: Multidetector CT imaging of the chest was performed following the standard protocol without IV contrast.  COMPARISON:  Chest x-ray 11/11/2013  FINDINGS: Subcutaneous emphysema along the left side of the neck and extending into the left chest anteriorly and posteriorly as well as into the left shoulder. Pneumomediastinum. Small left pneumothorax. Airspace disease in both lung bases, greater on the left, possibly indicating atelectasis or contusion. The heart size and pulmonary vascularity appear normal. Esophagus is decompressed. No significant lymphadenopathy in the chest. Visualized portions of the upper abdominal organs appear grossly unremarkable. Nondisplaced acute fracture of the left posterior  lateral tenth rib. No additional rib fractures are appreciated. Sternum and thoracic spine appear intact.  IMPRESSION: Nondisplaced fracture of the left posterior lateral tenth rib. Extensive subcutaneous emphysema in the left chest and neck. Pneumomediastinum. Small left pneumothorax. Contusion or atelectasis in the left lung base.   Electronically Signed   By: Lucienne Capers M.D.   On: 11/11/2013 02:55   Dg Chest Port 1 View  11/11/2013   CLINICAL DATA:  Trauma.  Fall.  Left rib pain.  EXAM: PORTABLE CHEST - 1 VIEW  COMPARISON:  None.  FINDINGS: Moderately prominent subcutaneous emphysema along the left chest wall and left neck. Linear lucencies in the left side of the mediastinum consistent with pneumomediastinum. Small left apical pneumothorax. Nondisplaced fracture demonstrated in the left anterolateral eighth rib and probably also in the seventh and ninth ribs. Pulmonary hyperinflation may suggest underlying emphysematous change. Heart size and pulmonary vascularity are normal. No focal consolidation or collapse in the lungs.  IMPRESSION: Left rib fractures with subcutaneous emphysema, small left pneumothorax, and pneumomediastinum.  These results were called by telephone at the time of interpretation on 11/11/2013 at 2:01 AM to Dr. Elyn Peers , who verbally acknowledged these results.   Electronically Signed   By: Lucienne Capers M.D.   On: 11/11/2013 02:05    Review of Systems  Constitutional: Negative for fever and chills.  HENT: Negative.   Eyes: Negative.   Respiratory: Positive for shortness of breath.   Cardiovascular: Positive for chest pain.  Gastrointestinal: Negative for nausea, vomiting, abdominal pain, diarrhea and constipation.  Genitourinary: Negative.   Musculoskeletal: Positive for back pain.  Skin: Negative.   Neurological: Negative.   Endo/Heme/Allergies: Negative.   Psychiatric/Behavioral: Negative.     Blood pressure 103/59, pulse 66, temperature 97.7 F (36.5 C),  temperature source Oral, resp. rate 10, height '5\' 11"'  (1.803 m), weight 129 lb (58.514 kg), SpO2 100.00%. Physical Exam  Constitutional: He is oriented to person, place, and time. He appears well-developed and well-nourished.  HENT:  Head: Normocephalic and atraumatic. Head is without abrasion and without contusion.  Right Ear: Hearing, tympanic membrane, external ear and ear canal normal.  Left Ear: Hearing, tympanic membrane, external  ear and ear canal normal.  Nose: No nose lacerations or sinus tenderness.  Mouth/Throat: Uvula is midline, oropharynx is clear and moist and mucous membranes are normal.  Eyes: EOM are normal. Pupils are equal, round, and reactive to light. Right eye exhibits no discharge. Left eye exhibits no discharge. No scleral icterus.  Neck: Normal range of motion. No tracheal deviation present.  No posterior midline tenderness, no pain with active range of motion  Cardiovascular: Normal rate, normal heart sounds and intact distal pulses.   Respiratory: Effort normal. No stridor. No respiratory distress. He has no wheezes. He has no rales. He exhibits tenderness.  Left lateral and posterior rib tenderness, extensive subcutaneous emphysema along left chest wall extending up to his neck and down to his buttocks  GI: Soft. He exhibits no distension. There is no tenderness. There is no rebound and no guarding.  Musculoskeletal: Normal range of motion. He exhibits no tenderness.  Neurological: He is alert and oriented to person, place, and time. He has normal strength. He displays no atrophy and no tremor. He exhibits normal muscle tone. He displays no seizure activity. GCS eye subscore is 4. GCS verbal subscore is 5. GCS motor subscore is 6.  Skin: Skin is warm.  Multiple colored tattoos  Psychiatric: He has a normal mood and affect.     Assessment/Plan Status post fall with left posterior rib fracture, pneumothorax, and extensive subcutaneous emphysema. We'll place chest  tube and admit to trauma service. Plan was discussed in detail with the patient.  Dareld Mcauliffe E 11/11/2013, 3:51 AM

## 2013-11-11 NOTE — Progress Notes (Signed)
Patient ID: Jared ArenasKenneth [name], male   DOB: 05/01/1966, 48 y.o.   MRN: [id number]  Sleeping Stable with CT on suction.  O2 sats stable  Check CXR in morning 7/12

## 2013-11-11 NOTE — ED Notes (Signed)
Emergency verbal consent gotten from the pt for chest tube placement for pneumothorax. Dr. Janee Mornhompson at the bedside, time out completed.

## 2013-11-11 NOTE — Procedures (Signed)
Chest Tube Insertion Procedure Note  Indications:  Left pneumothorax  Pre-operative Diagnosis: Left pneumothorax  Post-operative Diagnosis: Left pneumothorax  Procedure Details  Emergency consent was obtained for the procedure secondary to the patient receiving narcotics, including sedation.  Risks of lung perforation, hemorrhage, arrhythmia, and adverse drug reaction were discussed.   After sterile skin prep, using standard technique, a 20 French tube was placed in the L anterior axillary line at the nipple level. Rush of air present. Tube connected to Pleur-evac. Sterile dressing applied.  Estimated Blood Loss:  Minimal         Specimens:  None              Complications:  None.         Disposition: ED and will admit to step down unit         Condition: stable  Jared GelinasBurke Chealsea Paske, MD, MPH, FACS Trauma: [phone] General Surgery: [phone]

## 2013-11-12 ENCOUNTER — Inpatient Hospital Stay (HOSPITAL_COMMUNITY): Payer: BC Managed Care – PPO

## 2013-11-12 DIAGNOSIS — E870 Hyperosmolality and hypernatremia: Secondary | ICD-10-CM

## 2013-11-12 DIAGNOSIS — F1010 Alcohol abuse, uncomplicated: Secondary | ICD-10-CM

## 2013-11-12 LAB — BASIC METABOLIC PANEL
Anion gap: 14 (ref 5–15)
BUN: 4 mg/dL — ABNORMAL LOW (ref 6–23)
CALCIUM: 8.4 mg/dL (ref 8.4–10.5)
CHLORIDE: 89 meq/L — AB (ref 96–112)
CO2: 25 mEq/L (ref 19–32)
CREATININE: 0.51 mg/dL (ref 0.50–1.35)
GFR calc Af Amer: >90 mL/min (ref >90)
GFR calc non Af Amer: >90 mL/min (ref >90)
GLUCOSE: 104 mg/dL — AB (ref 70–99)
Potassium: 4.2 mEq/L (ref 3.7–5.3)
Sodium: 128 mEq/L — ABNORMAL LOW (ref 137–147)

## 2013-11-12 LAB — CBC
HCT: 37.6 % — ABNORMAL LOW (ref 39.0–52.0)
Hemoglobin: 13.3 g/dL (ref 13.0–17.0)
MCH: 33.1 pg (ref 26.0–34.0)
MCHC: 35.4 g/dL (ref 30.0–36.0)
MCV: 93.5 fL (ref 78.0–100.0)
Platelets: 185 10*3/uL (ref 150–400)
RBC: 4.02 MIL/uL — ABNORMAL LOW (ref 4.22–5.81)
RDW: 13 % (ref 11.5–15.5)
WBC: 7.6 10*3/uL (ref 4.0–10.5)

## 2013-11-12 MED ORDER — SODIUM CHLORIDE 0.9 % IV SOLN
INTRAVENOUS | Status: DC
  Administered 2013-11-12 – 2013-11-13 (×2): via INTRAVENOUS

## 2013-11-12 NOTE — Progress Notes (Signed)
H2O seal CT CXR in AM Patient examined and I agree with the assessment and plan  Violeta GelinasBurke Sarrah Fiorenza, MD, MPH, FACS Trauma: [phone] General Surgery: [phone]  11/12/2013 1:41 PM

## 2013-11-12 NOTE — Progress Notes (Signed)
Patient ID: Jared [name], male   DOB: 03/02/1966, 48 y.o.   MRN: [id number]  LOS: 1 day   Subjective: VSS.  Afebrile.  Up to bathroom, but no other ambulation.  Low Na, denies weakness, HA.  Denies SOB.    Objective: Vital signs in last 24 hours: Temp:  [97.5 F (36.4 C)-98.6 F (37 C)] 98 F (36.7 C) (07/12 0733) Pulse Rate:  [66-75] 66 (07/12 0733) Resp:  [16-22] 16 (07/12 0733) BP: (115-139)/(73-83) 133/81 mmHg (07/12 0733) SpO2:  [95 %-100 %] 100 % (07/12 0733)    Lab Results:  CBC  Recent Labs  11/11/13 0216 11/12/13 0320  WBC 11.0* 7.6  HGB 13.9 13.3  HCT 38.3* 37.6*  PLT 218 185   BMET  Recent Labs  11/11/13 0216 11/12/13 0320  NA 131* 128*  K 3.8 4.2  CL 92* 89*  CO2 23 25  GLUCOSE 99 104*  BUN 5* 4*  CREATININE 0.46* 0.51  CALCIUM 8.4 8.4    Imaging: Ct Chest Wo Contrast  11/11/2013   CLINICAL DATA:  Fall. Shortness of breath. Left-sided chest pain. Chest injury.  EXAM: CT CHEST WITHOUT CONTRAST  TECHNIQUE: Multidetector CT imaging of the chest was performed following the standard protocol without IV contrast.  COMPARISON:  Chest x-ray 11/11/2013  FINDINGS: Subcutaneous emphysema along the left side of the neck and extending into the left chest anteriorly and posteriorly as well as into the left shoulder. Pneumomediastinum. Small left pneumothorax. Airspace disease in both lung bases, greater on the left, possibly indicating atelectasis or contusion. The heart size and pulmonary vascularity appear normal. Esophagus is decompressed. No significant lymphadenopathy in the chest. Visualized portions of the upper abdominal organs appear grossly unremarkable. Nondisplaced acute fracture of the left posterior lateral tenth rib. No additional rib fractures are appreciated. Sternum and thoracic spine appear intact.  IMPRESSION: Nondisplaced fracture of the left posterior lateral tenth rib. Extensive subcutaneous emphysema in the left chest and neck. Pneumomediastinum.  Small left pneumothorax. Contusion or atelectasis in the left lung base.   Electronically Signed   By: Burman NievesWilliam  Stevens M.D.   On: 11/11/2013 02:55   Dg Chest Portable 1 View  11/11/2013   CLINICAL DATA:  Chest tube insertion  EXAM: PORTABLE CHEST - 1 VIEW  COMPARISON:  11/11/2013  FINDINGS: Interval placement of a left chest tube. No definite residual pneumothorax. Subcutaneous emphysema and pneumomediastinum remain unchanged. No focal infiltration or atelectasis in the lungs. Heart size and pulmonary vascularity are normal.  IMPRESSION: Interval placement of left chest tube without significant residual pneumothorax identified.   Electronically Signed   By: Burman NievesWilliam  Stevens M.D.   On: 11/11/2013 04:11   Dg Chest Port 1 View  11/11/2013   CLINICAL DATA:  Trauma.  Fall.  Left rib pain.  EXAM: PORTABLE CHEST - 1 VIEW  COMPARISON:  None.  FINDINGS: Moderately prominent subcutaneous emphysema along the left chest wall and left neck. Linear lucencies in the left side of the mediastinum consistent with pneumomediastinum. Small left apical pneumothorax. Nondisplaced fracture demonstrated in the left anterolateral eighth rib and probably also in the seventh and ninth ribs. Pulmonary hyperinflation may suggest underlying emphysematous change. Heart size and pulmonary vascularity are normal. No focal consolidation or collapse in the lungs.  IMPRESSION: Left rib fractures with subcutaneous emphysema, small left pneumothorax, and pneumomediastinum.  These results were called by telephone at the time of interpretation on 11/11/2013 at 2:01 AM to Dr. Brandt LoosenJULIE MANLY , who verbally acknowledged these results.  Electronically Signed   By: Burman Nieves M.D.   On: 11/11/2013 02:05     PE: General appearance: alert, cooperative and no distress Resp: clear to auscultation bilaterally.  Left CT with minimal serosanguineous output.  Still with SQ air left supra and subclavicular region.   Cardio: regular rate and rhythm, S1,  S2 normal, no murmur, click, rub or gallop GI: soft, non-tender; bowel sounds normal; no masses,  no organomegaly Extremities: extremities normal, atraumatic, no cyanosis or edema    Patient Active Problem List   Diagnosis Date Noted  . Pneumothorax, left 11/11/2013  . Alcohol dependence 10/10/2012  . Substance induced mood disorder 10/10/2012     Assessment/Plan: Fall Left posterior rib fracture with PTX and subcutaneous emphysema -no air leak, CXR stable.  Water seal CT, repeat CXR in AM, IS, mobilize, pain control Hyponatremia-d/t alcohol use.  change D51/2NS to NS, repeat labs in AM EtOH abuse-CIWA protocol  VTE - SCD's, Lovenox  FEN - tolerating diet, PO pain meds Dispo -- transfer to [employer], ANP-BC Pager: [phone] General Trauma PA Pager: [phone]   11/12/2013 8:37 AM

## 2013-11-12 NOTE — Progress Notes (Signed)
Received transfer orders, pt and family aware. No s/s of acute distress noted. Report called to RN on 6N.

## 2013-11-13 ENCOUNTER — Inpatient Hospital Stay (HOSPITAL_COMMUNITY): Payer: BC Managed Care – PPO

## 2013-11-13 DIAGNOSIS — S2232XA Fracture of one rib, left side, initial encounter for closed fracture: Secondary | ICD-10-CM

## 2013-11-13 DIAGNOSIS — E871 Hypo-osmolality and hyponatremia: Secondary | ICD-10-CM | POA: Diagnosis present

## 2013-11-13 DIAGNOSIS — W19XXXA Unspecified fall, initial encounter: Secondary | ICD-10-CM

## 2013-11-13 LAB — BASIC METABOLIC PANEL
ANION GAP: 12 (ref 5–15)
BUN: 5 mg/dL — AB (ref 6–23)
CALCIUM: 8.8 mg/dL (ref 8.4–10.5)
CO2: 28 meq/L (ref 19–32)
CREATININE: 0.6 mg/dL (ref 0.50–1.35)
Chloride: 99 mEq/L (ref 96–112)
GFR calc Af Amer: >90 mL/min (ref >90)
GFR calc non Af Amer: >90 mL/min (ref >90)
Glucose, Bld: 105 mg/dL — ABNORMAL HIGH (ref 70–99)
Potassium: 3.8 mEq/L (ref 3.7–5.3)
Sodium: 139 mEq/L (ref 137–147)

## 2013-11-13 MED ORDER — OXYCODONE-ACETAMINOPHEN 5-325 MG PO TABS: 1.0000 | ORAL_TABLET | ORAL | Status: AC | PRN

## 2013-11-13 NOTE — Discharge Instructions (Signed)
Wash wounds daily in shower with soap and water starting Wednesday. Do not soak. Apply antibiotic ointment (e.g. Neosporin) twice daily and as needed to keep moist. Cover with dry dressing.  No driving while taking oxycodone.

## 2013-11-13 NOTE — Discharge Summary (Signed)
Physician Discharge Summary  Patient ID: Jared ArenasKenneth [name] MRN: [id number] DOB/AGE: 46/07/1965 46 y.o.  Admit date: 11/11/2013 Discharge date: 11/13/2013  Discharge Diagnoses Patient Active Problem List   Diagnosis Date Noted  . Fall 11/13/2013  . Left rib fracture 11/13/2013  . Hyponatremia 11/13/2013  . Traumatic pneumothorax 11/11/2013  . Alcohol dependence 10/10/2012  . Substance induced mood disorder 10/10/2012    Consultants None   Procedures Left tube thoracostomy by Dr. Violeta GelinasBurke Thompson   HPI: Jared FallenKenneth was on his deck when he fell backwards striking his left posterior chest. He had significant pain in that area associated with shortness of breath. His wife called EMS and he was transported to the emergency department. He was not a trauma code activation. Workup in the emergency department included chest x-ray and CT scan of the chest both demonstrating a left posterior 10th rib fracture with a small pneumothorax and extensive subcutaneous emphysema. He had a chest tube placed and was admitted to the trauma service.   Hospital Course: The patient's chest tube was able to be weaned to water seal and removed without difficulty. He did not suffer any respiratory compromise from his rib fracture and his pain was controlled on oral medications. He did have some hyponatremia that seemed chronic, likely secondary to his alcohol use, and was corrected while he was here. He was discharged home in good condition.      Medication List         oxyCODONE-acetaminophen 5-325 MG per tablet  Commonly known as:  ROXICET  Take 1-2 tablets by mouth every 4 (four) hours as needed (Pain).             Follow-up Information   Call [facility]. (As needed)    Contact information:   [address] [place] [address] [phone]       Signed: Freeman CaldronMichael J. Kaneesha Constantino, PA-C Pager: [phone] General Trauma PA Pager: [phone] 11/13/2013, 1:33 PM

## 2013-11-13 NOTE — Progress Notes (Signed)
Patient ID: Jared ArenasKenneth [name], male   DOB: 03/29/1966, 48 y.o.   MRN: [id number]   LOS: 2 days   Subjective: No new c/o.   Objective: Vital signs in last 24 hours: Temp:  [97.7 F (36.5 C)-98.5 F (36.9 C)] 98.4 F (36.9 C) (07/13 0630) Pulse Rate:  [64-82] 64 (07/13 0630) Resp:  [16-20] 20 (07/13 0630) BP: (103-120)/(66-77) 120/77 mmHg (07/13 0630) SpO2:  [95 %-100 %] 100 % (07/13 0630)    CT No air leak Minimal OP   Laboratory  BMET  Recent Labs  11/12/13 0320 11/13/13 0526  NA 128* 139  K 4.2 3.8  CL 89* 99  CO2 25 28  GLUCOSE 104* 105*  BUN 4* 5*  CREATININE 0.51 0.60  CALCIUM 8.4 8.8    Radiology Results CHEST 2 VIEW  COMPARISON: Portable chest x-ray of November 12, 2013  FINDINGS:  The tip of the chest tube on the left overlies the posterior aspect  of the fifth rib and is just slightly lower than previously  demonstrated. No definite pleural line is demonstrated but there is  considerable subcutaneous gas which is unchanged. The lung  parenchyma is clear bilaterally. There is a small left pleural  effusion layering posteriorly. The heart and mediastinal structures  are normal. The trachea is midline. The bony thorax is unremarkable.  IMPRESSION:  1. Stable appearance of the left chest tube and left-sided  subcutaneous emphysema. No pneumothorax is visible currently.  2. Small left pleural effusion layering posteriorly. The previously  suspected left lower lobe atelectasis is less conspicuous.  Electronically Signed  By: David SwazilandJordan  On: 11/13/2013 07:39   Physical Exam General appearance: alert and no distress Resp: clear to auscultation bilaterally Cardio: regular rate and rhythm GI: normal findings: bowel sounds normal and soft, non-tender   Assessment/Plan: Fall  Left posterior rib fracture with PTX and subcutaneous emphysema -- D/C CT later this am Hyponatremia- Resolved EtOH abuse-CIWA protocol  VTE - SCD's, Lovenox  FEN - tolerating  diet, PO pain meds  Dispo -- CXR later today, then d/c if ok    Freeman CaldronMichael J. Kian Ottaviano, PA-C Pager: [phone] General Trauma PA Pager: [phone]  11/13/2013

## 2013-11-13 NOTE — Progress Notes (Signed)
Reviewed discharge information with pt. Education on care of wound/incision site reviewed. Pt reported that he does not have any questions at this time. Smoking cessation nformation was offered but pt declined. In discharge packet it has website and information if pt would like to stop smoking. Pt ready for discharge when wife arrives to drive pt home.

## 2016-01-09 IMAGING — CR DG CHEST 1V PORT
1 series · 1 of 1 positions shown · non-contrast
Comparison: 11/11/2013; chest CT -11/11/2013

CLINICAL DATA: Left-sided pneumothorax, post fall

EXAM:
PORTABLE CHEST - 1 VIEW

[AP]
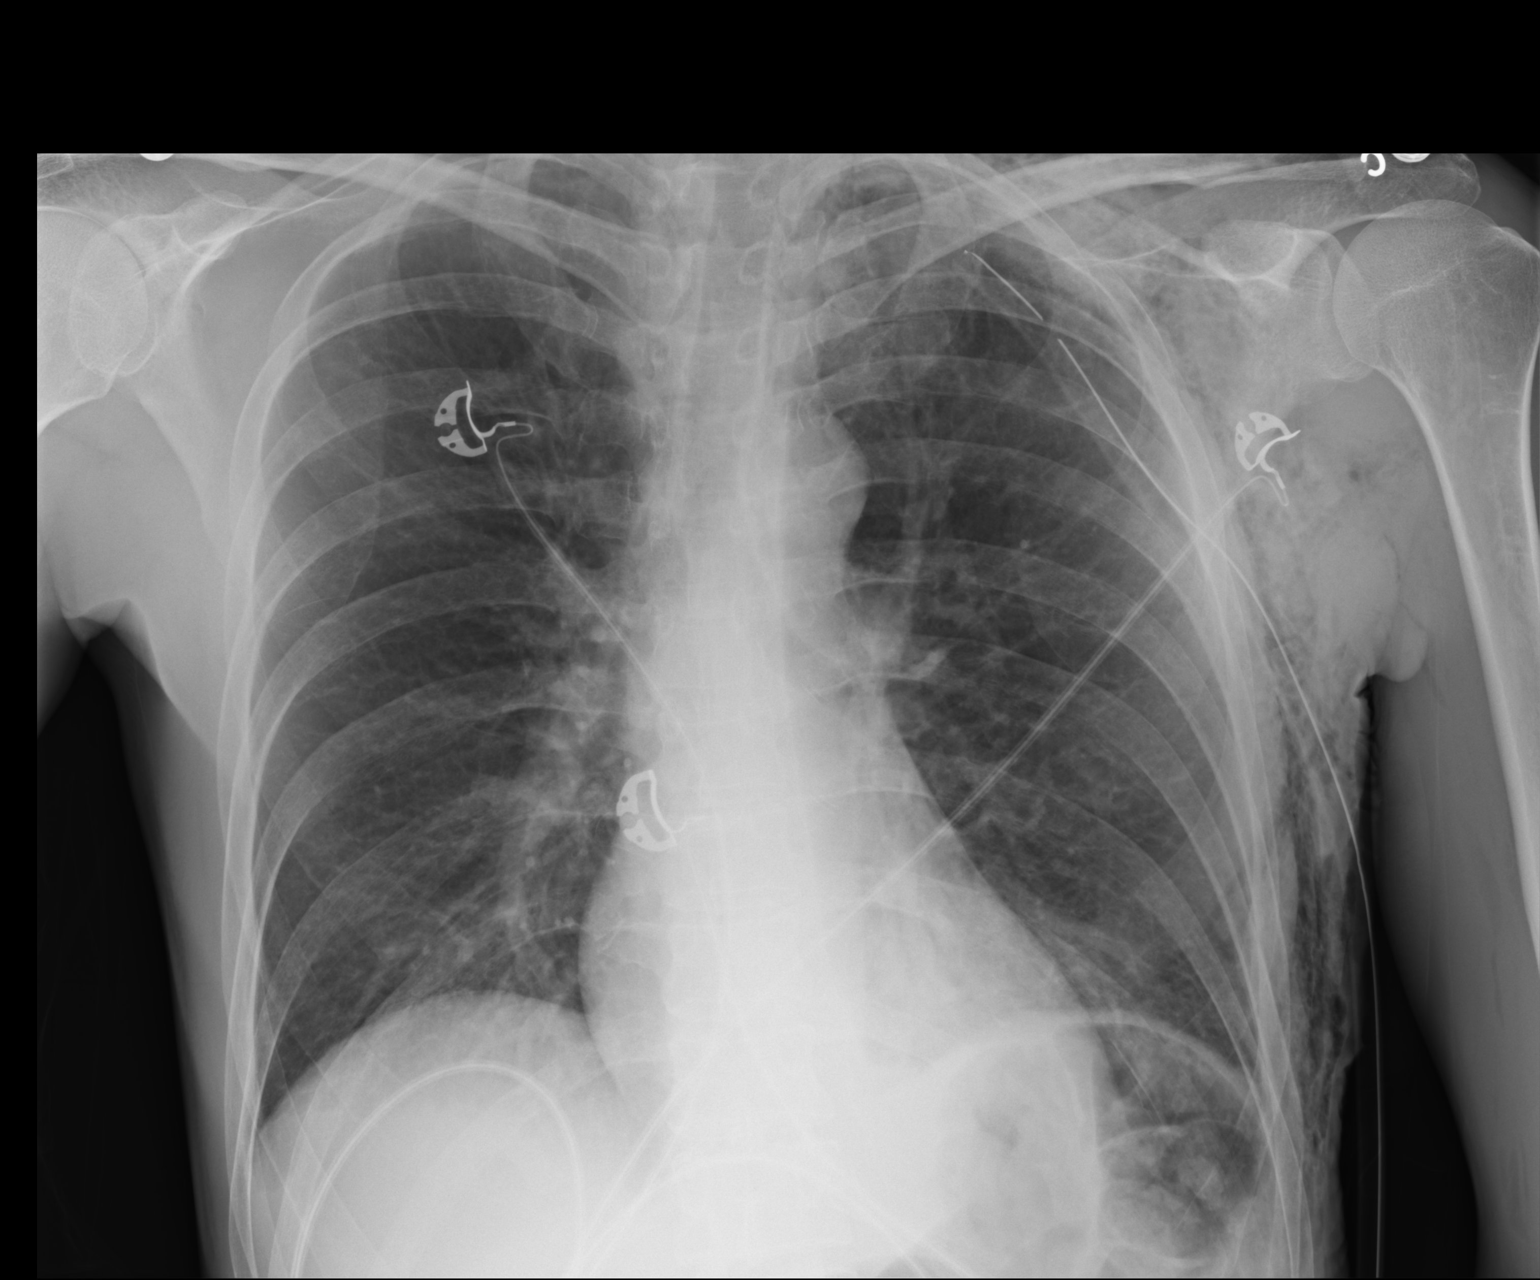

[1 of 1 positions shown; findings below may reference images not displayed]

FINDINGS: Grossly unchanged cardiac silhouette and mediastinal contours.
Stable position of support apparatus. No pneumothorax. The amount of
left lateral chest wall subcutaneous edema is unchanged to minimally
decreased in amount in the interval. Slight worsening of left
basilar/ retrocardiac opacities favored to represent atelectasis. No
pleural effusion. No evidence of edema. Unchanged bones. Known
minimally displaced left posterior lateral tenth rib fractures not
well demonstrated on the present examination.
IMPRESSION: 1. Stable positioning of support apparatus. No pneumothorax. The
amount of left lateral chest wall subcutaneous emphysema is
unchanged to minimally decreased in the interval.
2. Worsening left basilar/retrocardiac atelectasis.

## 2016-01-10 IMAGING — CR DG CHEST 2V
2 series · 2 of 2 positions shown · non-contrast
Comparison: 11/13/2013

CLINICAL DATA: Chest tube removal

EXAM:
CHEST  2 VIEW

[w chest pa]
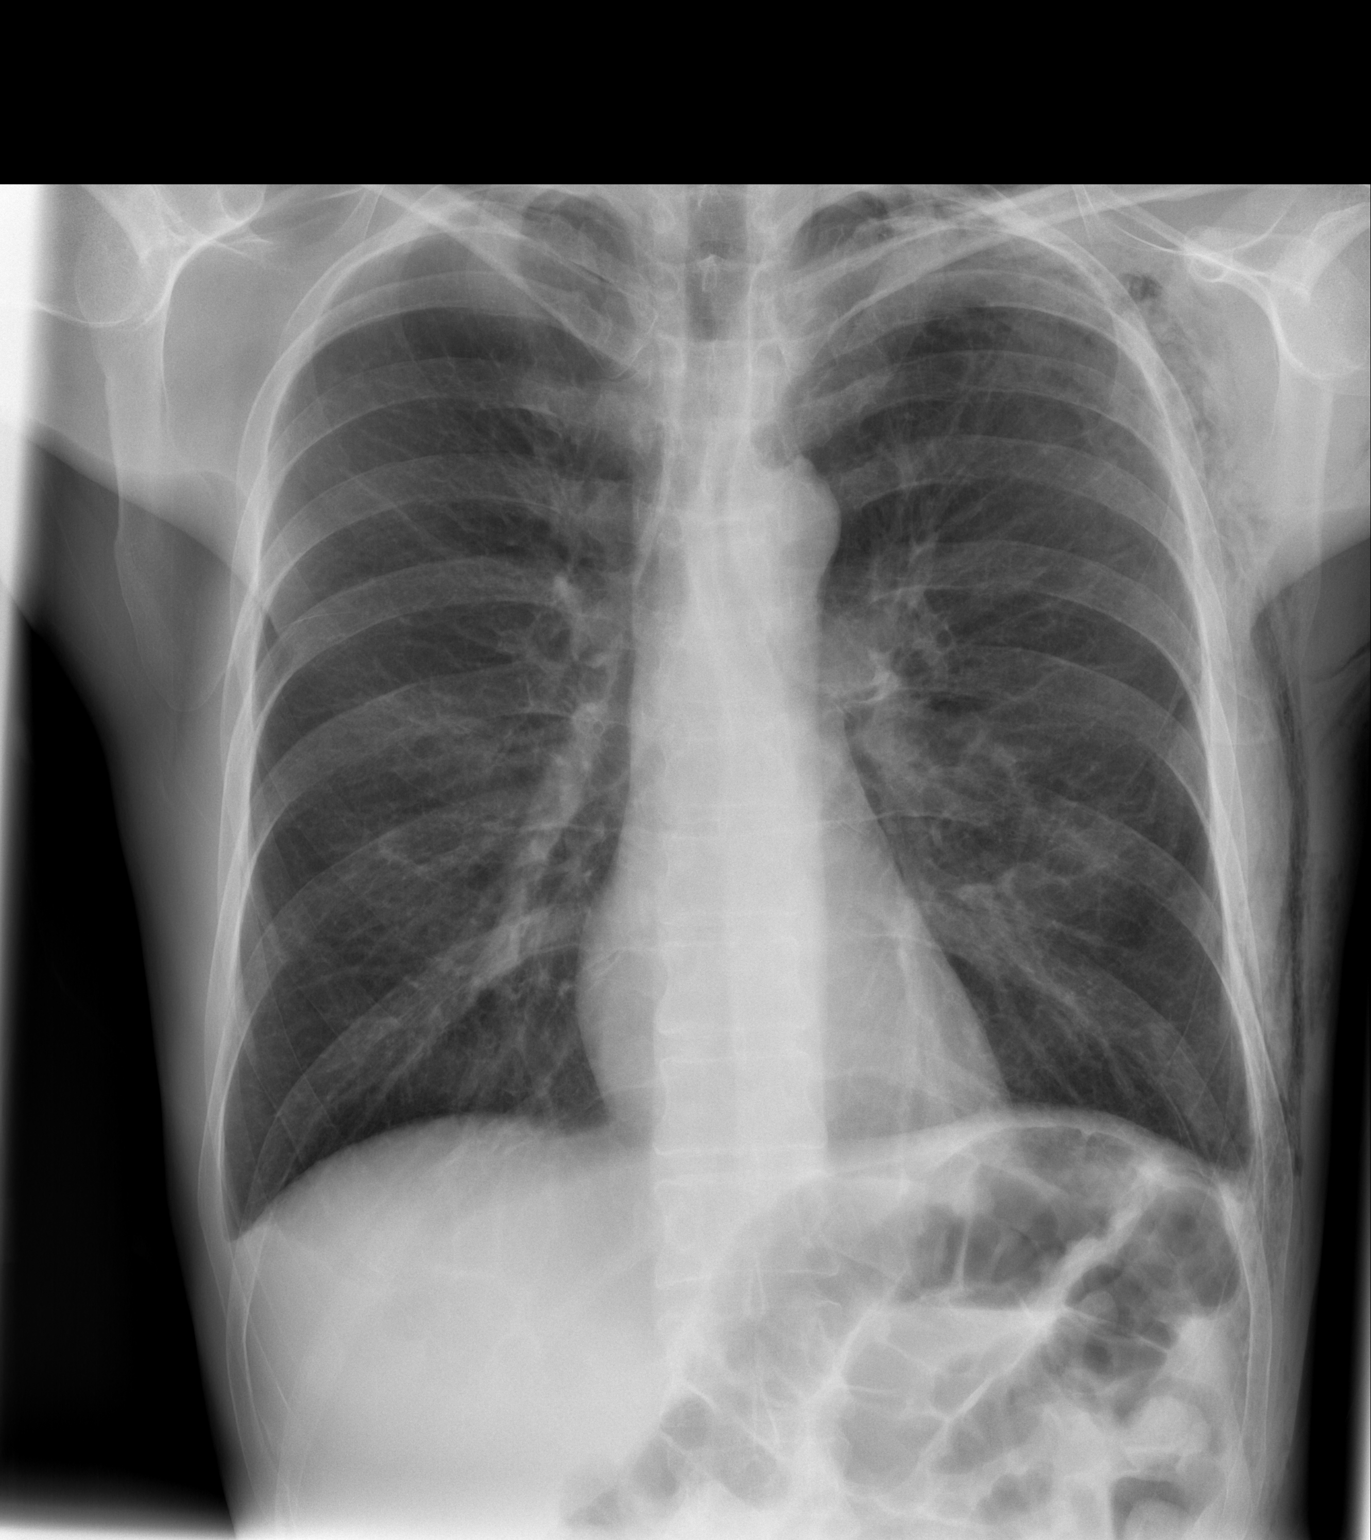

[w chest lat]
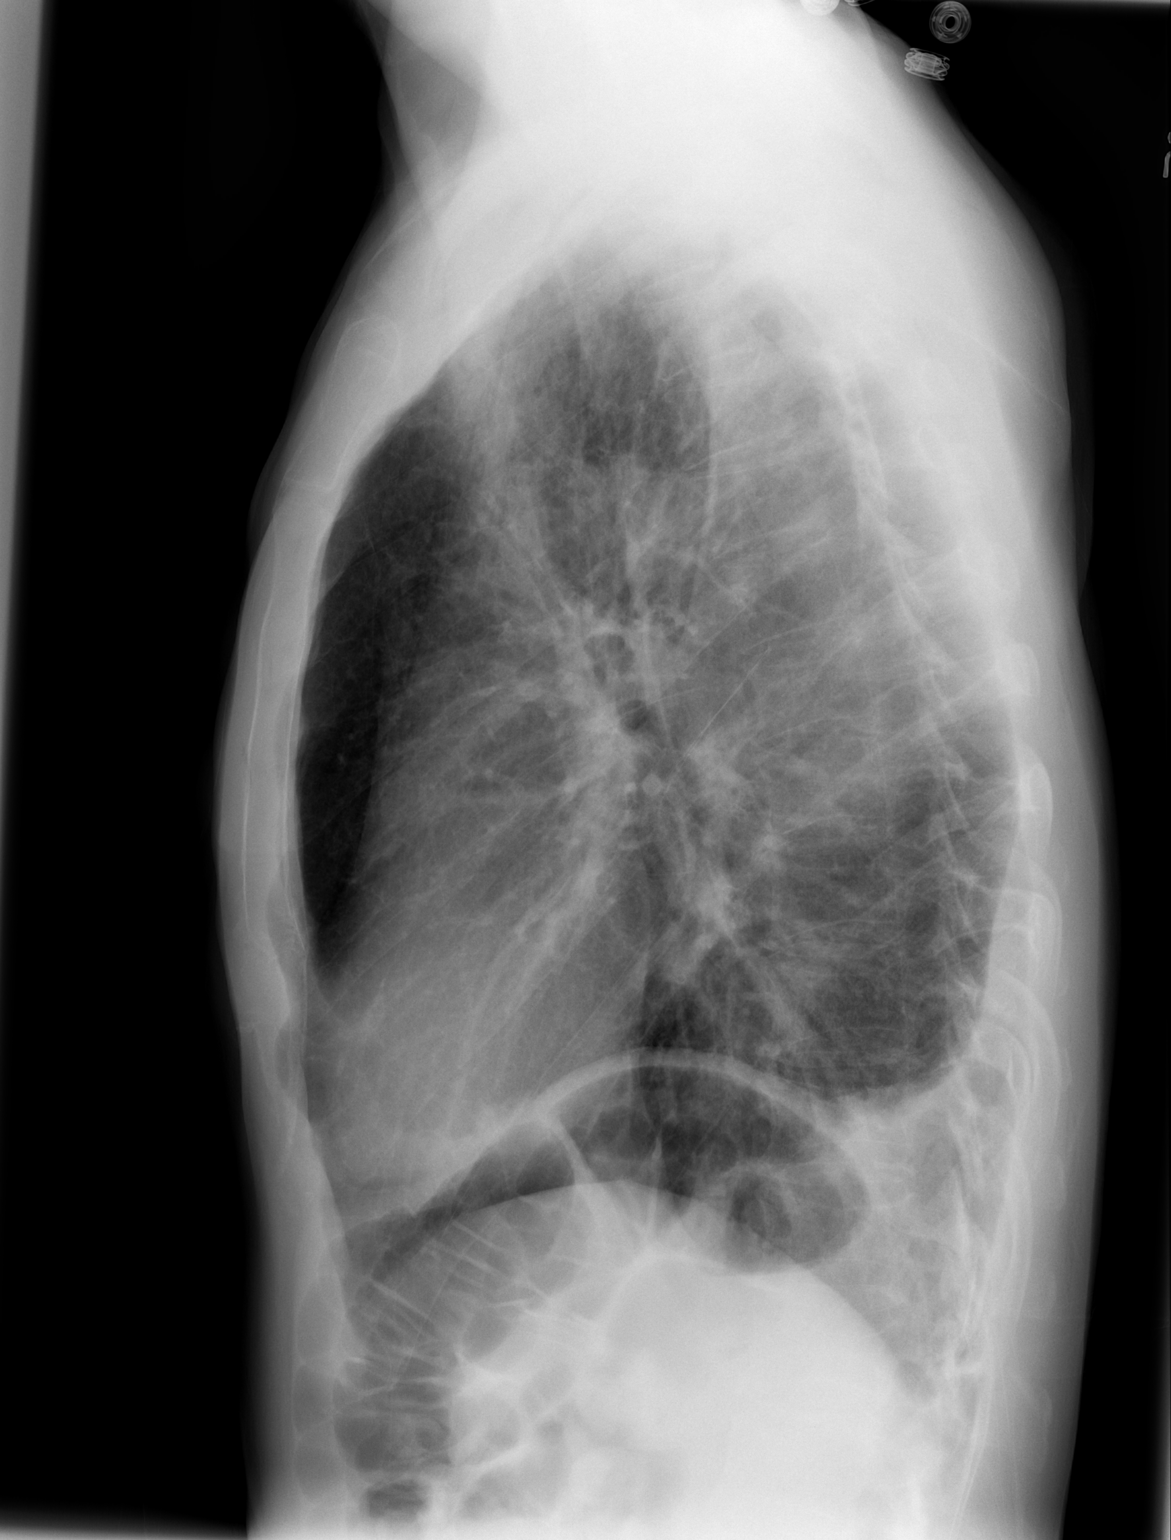

[2 of 2 positions shown; findings below may reference images not displayed]

FINDINGS: Left-sided subcutaneous emphysema is noted. No pneumothorax is
identified. No midline shift. Hyperinflation suggesting emphysema
reidentified. Small left pleural effusion is again noted. Heart size
is normal.
IMPRESSION: No pneumothorax after removal of left-sided chest tube.

Left-sided subcutaneous emphysema.

## 2018-01-13 ENCOUNTER — Ambulatory Visit: Payer: Self-pay | Admitting: Nurse Practitioner

## 2018-01-13 VITALS — BP 122/80 | HR 78 | Temp 98.1°F | Resp 20 | Wt 125.6 lb

## 2018-01-13 DIAGNOSIS — L0231 Cutaneous abscess of buttock: Secondary | ICD-10-CM

## 2018-01-13 MED ORDER — CEPHALEXIN 500 MG PO CAPS: 500.0000 mg | ORAL_CAPSULE | Freq: Three times a day (TID) | ORAL | 0 refills | Status: AC

## 2018-01-13 NOTE — Progress Notes (Signed)
Subjective:    Jared [name] is a 52 y.o. male who presents for evaluation of a possible skin infection located on right and left buttocks.  States this is reocurring, but this current episode started about 3 weeks ago.  Symptoms include mild pain. Patient denies chills and fever greater than 100. Precipitating event: none known. Treatment to date has included OTC boil products with minimal relief.  The following portions of the patient's history were reviewed and updated as appropriate: allergies, current medications and past medical history.  Review of Systems Constitutional: negative Ears, nose, mouth, throat, and face: negative Respiratory: negative Cardiovascular: negative Gastrointestinal: negative Integument/breast: positive for skin color change and boils to right/left buttocks, negative for pruritus     Objective:    BP 122/80 (BP Location: Right Arm, Patient Position: Sitting, Cuff Size: Normal)   Pulse 78   Temp 98.1 F (36.7 C) (Oral)   Resp 20   Wt 125 lb 9.6 oz (57 kg)   SpO2 97%   BMI 17.52 kg/m  General appearance: alert, cooperative and no distress Head: Normocephalic, without obvious abnormality, atraumatic Throat: lips, mucosa, and tongue normal; teeth and gums normal Lungs: clear to auscultation bilaterally Heart: regular rate and rhythm, S1, S2 normal, no murmur, click, rub or gallop Abdomen: soft, non-tender; bowel sounds normal; no masses,  no organomegaly Pulses: 2+ and symmetric Skin: induration to lower quadrant of outer left buttock, firm, erythematous, no fluctuance, no punctum. 2nd area to outer lower quadrant of right buttock, area is approximately 2cm x 2cm in diameter, no fluctuance, appears to have drained, mild erythema Lymph nodes: cervical and submandibular nodes normal Neurologic: Grossly normal     Assessment:   Skin Abscess to Bilateral Buttocks  Plan:   Exam findings, diagnosis etiology and medication use and indications reviewed with  patient. Follow- Up and discharge instructions provided. No emergent/urgent issues found on exam.  Patient education was provided. Patient verbalized understanding of information provided and agrees with plan of care (POC), all questions answered. The patient is advised to call or return to clinic if he does not see an improvement in symptoms, or to seek the care of the closest emergency department if he worsens with the above plan.   1. Cutaneous abscess of buttock  - cephALEXin (KEFLEX) 500 MG capsule; Take 1 capsule (500 mg total) by mouth 3 (three) times daily for 14 days.  Dispense: 42 capsule; Refill: 0 -Take medication as prescribed. -Warm saltwater soaks using Epsom salt. -Use Hibiclens soap or Dial gold bar antibacterial soap to these areas at least twice daily until symptoms improve -Ibuprofen or Tylenol for pain, fever or general discomfort. -Follow up in the ER if you develop fever, chills, foul smelling drainage, increased redness or swelling. -Follow up as needed.

## 2018-01-13 NOTE — Patient Instructions (Signed)
Skin Abscess  -Take medication as prescribed. -Warm saltwater soaks using Epsom salt. -Use Hibiclens soap or Dial gold bar antibacterial soap to these areas at least twice daily until symptoms improve -Ibuprofen or Tylenol for pain, fever or general discomfort. -Follow up in the ER if you develop fever, chills, foul smelling drainage, increased redness or swelling. -Follow up as needed.   A skin abscess is an infected area on or under your skin that contains a collection of pus and other material. An abscess may also be called a furuncle, carbuncle, or boil. An abscess can occur in or on almost any part of your body. Some abscesses break open (rupture) on their own. Most continue to get worse unless they are treated. The infection can spread deeper into the body and eventually into your blood, which can make you feel ill. Treatment usually involves draining the abscess. What are the causes? An abscess occurs when germs, often bacteria, pass through your skin and cause an infection. This may be caused by:  A scrape or cut on your skin.  A puncture wound through your skin, including a needle injection.  Blocked oil or sweat glands.  Blocked and infected hair follicles.  A cyst that forms beneath your skin (sebaceous cyst) and becomes infected.  What increases the risk? This condition is more likely to develop in people who:  Have a weak body defense system (immune system).  Have diabetes.  Have dry and irritated skin.  Get frequent injections or use illegal IV drugs.  Have a foreign body in a wound, such as a splinter.  Have problems with their lymph system or veins.  What are the signs or symptoms? An abscess may start as a painful, firm bump under the skin. Over time, the abscess may get larger or become softer. Pus may appear at the top of the abscess, causing pressure and pain. It may eventually break through the skin and drain. Other symptoms  include:  Redness.  Warmth.  Swelling.  Tenderness.  A sore on the skin.  How is this diagnosed? This condition is diagnosed based on your medical history and a physical exam. A sample of pus may be taken from the abscess to find out what is causing the infection and what antibiotics can be used to treat it. You also may have:  Blood tests to look for signs of infection or spread of an infection to your blood.  Imaging studies such as ultrasound, CT scan, or MRI if the abscess is deep.  How is this treated? Small abscesses that drain on their own may not need treatment. Treatment for an abscess that does not rupture on its own may include:  Warm compresses applied to the area several times per day.  Incision and drainage. Your health care provider will make an incision to open the abscess and will remove pus and any foreign body or dead tissue. The incision area may be packed with gauze to keep it open for a few days while it heals.  Antibiotic medicines to treat infection. For a severe abscess, you may first get antibiotics through an IV and then change to oral antibiotics.  Follow these instructions at home: Abscess Care  If you have an abscess that has not drained, place a warm, clean, wet washcloth over the abscess several times a day. Do this as told by your health care provider.  Follow instructions from your health care provider about how to take care of your abscess. Make sure   you: ? Cover the abscess with a bandage (dressing). ? Change your dressing or gauze as told by your health care provider. ? Wash your hands with soap and water before you change the dressing or gauze. If soap and water are not available, use hand sanitizer.  Check your abscess every day for signs of a worsening infection. Check for: ? More redness, swelling, or pain. ? More fluid or blood. ? Warmth. ? More pus or a bad smell. Medicines  Take over-the-counter and prescription medicines only  as told by your health care provider.  If you were prescribed an antibiotic medicine, take it as told by your health care provider. Do not stop taking the antibiotic even if you start to feel better. General instructions  To avoid spreading the infection: ? Do not share personal care items, towels, or hot tubs with others. ? Avoid making skin contact with other people.  Keep all follow-up visits as told by your health care provider. This is important. Contact a health care provider if:  You have more redness, swelling, or pain around your abscess.  You have more fluid or blood coming from your abscess.  Your abscess feels warm to the touch.  You have more pus or a bad smell coming from your abscess.  You have a fever.  You have muscle aches.  You have chills or a general ill feeling. Get help right away if:  You have severe pain.  You see red streaks on your skin spreading away from the abscess. This information is not intended to replace advice given to you by your health care provider. Make sure you discuss any questions you have with your health care provider. Document Released: 01/28/2005 Document Revised: 12/15/2015 Document Reviewed: 02/27/2015 Elsevier Interactive Patient Education  2018 [employer]  

## 2019-07-28 ENCOUNTER — Ambulatory Visit: Payer: Self-pay | Attending: Internal Medicine

## 2019-07-28 DIAGNOSIS — Z23 Encounter for immunization: Secondary | ICD-10-CM

## 2019-07-28 NOTE — Progress Notes (Signed)
   Covid-19 Vaccination Clinic  Name:  Kiron Osmun    MRN: [id number] DOB: [birth year]  07/28/2019  Mr. Bradly was observed post Covid-19 immunization for 15 minutes without incident. He was provided with Vaccine Information Sheet and instruction to access the V-Safe system.   Mr. Dangerfield was instructed to call 911 with any severe reactions post vaccine: Marland Kitchen Difficulty breathing  . Swelling of face and throat  . A fast heartbeat  . A bad rash all over body  . Dizziness and weakness   Immunizations Administered    Name Date Dose VIS Date Route   Pfizer COVID-19 Vaccine 07/28/2019  9:26 AM 0.3 mL 04/14/2019 Intramuscular   Manufacturer: [employer], [employer]   Lot: [id number]   NDC: [address]-3

## 2019-08-22 ENCOUNTER — Ambulatory Visit: Payer: Self-pay | Attending: Internal Medicine

## 2019-08-22 DIAGNOSIS — Z23 Encounter for immunization: Secondary | ICD-10-CM

## 2019-08-22 NOTE — Progress Notes (Signed)
   Covid-19 Vaccination Clinic  Name:  Jared [name]    MRN: [id number] DOB: 1966/04/29  08/22/2019  [name] was observed post Covid-19 immunization for 15 minutes without incident. He was provided with Vaccine Information Sheet and instruction to access the V-Safe system.   [name] was instructed to call 911 with any severe reactions post vaccine: Marland Kitchen Difficulty breathing  . Swelling of face and throat  . A fast heartbeat  . A bad rash all over body  . Dizziness and weakness   Immunizations Administered    Name Date Dose VIS Date Route   Pfizer COVID-19 Vaccine 08/22/2019  9:26 AM 0.3 mL 06/28/2018 Intramuscular   Manufacturer: [employer], [employer]   Lot: [id number]   NDC: [address]-9
